# Patient Record
Sex: Female | Born: 1950
Health system: Southern US, Community
[De-identification: ages and names within clinical notes are randomized; demographics above are authoritative.]

## PROBLEM LIST (undated history)

## (undated) DIAGNOSIS — R011 Cardiac murmur, unspecified: Secondary | ICD-10-CM

## (undated) DIAGNOSIS — M1990 Unspecified osteoarthritis, unspecified site: Secondary | ICD-10-CM

## (undated) DIAGNOSIS — F419 Anxiety disorder, unspecified: Secondary | ICD-10-CM

## (undated) DIAGNOSIS — G43909 Migraine, unspecified, not intractable, without status migrainosus: Secondary | ICD-10-CM

## (undated) DIAGNOSIS — T7840XA Allergy, unspecified, initial encounter: Secondary | ICD-10-CM

## (undated) DIAGNOSIS — G43109 Migraine with aura, not intractable, without status migrainosus: Principal | ICD-10-CM

## (undated) HISTORY — DX: Migraine, unspecified, not intractable, without status migrainosus: G43.909

## (undated) HISTORY — DX: Cardiac murmur, unspecified: R01.1

## (undated) HISTORY — DX: Migraine with aura, not intractable, without status migrainosus: G43.109

## (undated) HISTORY — DX: Anxiety disorder, unspecified: F41.9

## (undated) HISTORY — DX: Unspecified osteoarthritis, unspecified site: M19.90

## (undated) HISTORY — DX: Allergy, unspecified, initial encounter: T78.40XA

## (undated) HISTORY — PX: LAPAROSCOPIC ABDOMINAL EXPLORATION: SHX6249

## (undated) HISTORY — PX: OTHER SURGICAL HISTORY: SHX169

---

## 1999-05-06 ENCOUNTER — Other Ambulatory Visit: Admission: RE | Admit: 1999-05-06 | Discharge: 1999-05-06 | Payer: Self-pay | Admitting: Obstetrics and Gynecology

## 2000-06-22 ENCOUNTER — Other Ambulatory Visit: Admission: RE | Admit: 2000-06-22 | Discharge: 2000-06-22 | Payer: Self-pay | Admitting: Obstetrics and Gynecology

## 2000-08-03 ENCOUNTER — Encounter: Payer: Self-pay | Admitting: Obstetrics and Gynecology

## 2000-08-03 ENCOUNTER — Encounter: Admission: RE | Admit: 2000-08-03 | Discharge: 2000-08-03 | Payer: Self-pay | Admitting: Obstetrics and Gynecology

## 2001-06-21 ENCOUNTER — Encounter: Admission: RE | Admit: 2001-06-21 | Discharge: 2001-06-21 | Payer: Self-pay | Admitting: Internal Medicine

## 2001-06-21 ENCOUNTER — Encounter: Payer: Self-pay | Admitting: Internal Medicine

## 2001-08-02 ENCOUNTER — Other Ambulatory Visit: Admission: RE | Admit: 2001-08-02 | Discharge: 2001-08-02 | Payer: Self-pay | Admitting: Obstetrics and Gynecology

## 2002-07-23 ENCOUNTER — Encounter: Admission: RE | Admit: 2002-07-23 | Discharge: 2002-07-23 | Payer: Self-pay | Admitting: Internal Medicine

## 2002-07-23 ENCOUNTER — Encounter: Payer: Self-pay | Admitting: Internal Medicine

## 2002-09-11 ENCOUNTER — Other Ambulatory Visit: Admission: RE | Admit: 2002-09-11 | Discharge: 2002-09-11 | Payer: Self-pay | Admitting: Obstetrics and Gynecology

## 2003-07-04 ENCOUNTER — Encounter: Payer: Self-pay | Admitting: Internal Medicine

## 2003-11-19 ENCOUNTER — Other Ambulatory Visit: Admission: RE | Admit: 2003-11-19 | Discharge: 2003-11-19 | Payer: Self-pay | Admitting: Obstetrics and Gynecology

## 2004-12-06 ENCOUNTER — Other Ambulatory Visit: Admission: RE | Admit: 2004-12-06 | Discharge: 2004-12-06 | Payer: Self-pay | Admitting: Obstetrics and Gynecology

## 2004-12-27 ENCOUNTER — Ambulatory Visit: Payer: Self-pay | Admitting: Internal Medicine

## 2004-12-30 ENCOUNTER — Ambulatory Visit: Payer: Self-pay | Admitting: Internal Medicine

## 2004-12-30 DIAGNOSIS — K3189 Other diseases of stomach and duodenum: Secondary | ICD-10-CM | POA: Insufficient documentation

## 2004-12-30 DIAGNOSIS — R1013 Epigastric pain: Secondary | ICD-10-CM

## 2004-12-31 ENCOUNTER — Ambulatory Visit: Payer: Self-pay | Admitting: Internal Medicine

## 2005-02-03 ENCOUNTER — Ambulatory Visit: Payer: Self-pay | Admitting: Internal Medicine

## 2007-06-04 ENCOUNTER — Ambulatory Visit: Payer: Self-pay | Admitting: Internal Medicine

## 2007-11-02 DIAGNOSIS — R109 Unspecified abdominal pain: Secondary | ICD-10-CM | POA: Insufficient documentation

## 2007-11-02 DIAGNOSIS — M5030 Other cervical disc degeneration, unspecified cervical region: Secondary | ICD-10-CM | POA: Insufficient documentation

## 2007-11-02 DIAGNOSIS — F411 Generalized anxiety disorder: Secondary | ICD-10-CM | POA: Insufficient documentation

## 2009-10-19 ENCOUNTER — Encounter (INDEPENDENT_AMBULATORY_CARE_PROVIDER_SITE_OTHER): Payer: Self-pay | Admitting: *Deleted

## 2009-12-03 ENCOUNTER — Encounter (INDEPENDENT_AMBULATORY_CARE_PROVIDER_SITE_OTHER): Payer: Self-pay | Admitting: *Deleted

## 2009-12-04 ENCOUNTER — Ambulatory Visit: Payer: Self-pay | Admitting: Internal Medicine

## 2009-12-18 ENCOUNTER — Ambulatory Visit: Payer: Self-pay | Admitting: Internal Medicine

## 2010-09-23 NOTE — Procedures (Signed)
Summary: Colonoscopy  Patient: Beth Chapman Note: All result statuses are Final unless otherwise noted.  Tests: (1) Colonoscopy (COL)   COL Colonoscopy           DONE     [facility]     [address]     [place], [place]  [address]           COLONOSCOPY PROCEDURE REPORT           PATIENT:  [name], [name]  MR#:  [id number]     BIRTHDATE:  [birth year], 58 yrs. old  GENDER:  female     ENDOSCOPIST:  Wilhemina Bonito. Eda Keys, MD     REF. BY:  Screening Recall     PROCEDURE DATE:  12/18/2009     PROCEDURE:  Average-risk screening colonoscopy     G0121     ASA CLASS:  Class II     INDICATIONS:  Routine Risk Screening ; NEGATIVE INDEX EXAM     06-2003; BROTHER AND SISTER WITH COLON POLYPS     MEDICATIONS:   Fentanyl 100 mcg IV, Versed 10 mg IV           DESCRIPTION OF PROCEDURE:   After the risks benefits and     alternatives of the procedure were thoroughly explained, informed     consent was obtained.  Digital rectal exam was performed and     revealed no abnormalities.   The LB CF-[id number] endoscope     was introduced through the anus and advanced to the cecum, which     was identified by both the appendix and ileocecal valve, without     limitations. TIME TO CECUM = 5:13 MIN. The quality of the prep was     excellent, using MoviPrep.  The instrument was then slowly     withdrawn (TIME = 11:29 MIN) as the colon was fully examined.     <<PROCEDUREIMAGES>>           FINDINGS:  A normal appearing cecum, ileocecal valve, and     appendiceal orifice were identified. The ascending, hepatic     flexure, transverse, splenic flexure, descending, sigmoid colon,     and rectum appeared unremarkable.  No polyps or cancers were seen.     Retroflexed views in the rectum revealed internal hemorrhoids.     The scope was then withdrawn from the patient and the procedure     completed.           COMPLICATIONS:  None     ENDOSCOPIC IMPRESSION:     1) Normal colon     2) No  polyps or cancers     3) Internal hemorrhoids           RECOMMENDATIONS:     1) Continue current colorectal screening recommendations for     "routine risk" patients with a repeat colonoscopy in 10 years.           ______________________________     Wilhemina Bonito. Eda Keys, MD           CC:  Nila Nephew, MD; The Patient           n.     eSIGNED:   Wilhemina Bonito. Eda Keys at 12/18/2009 02:33 PM           Gearldine Bienenstock, [id number]  Note: An exclamation mark (!) indicates a result that was not dispersed into the flowsheet. Document Creation Date: 12/18/2009 2:34 PM _______________________________________________________________________  (1)  Order result status: Final Collection or observation date-time: 12/18/2009 14:21 Requested date-time:  Receipt date-time:  Reported date-time:  Referring Physician:   Ordering Physician: Fransico Setters [phone]) Specimen Source:  Source: Launa Grill Order Number: [id number] Lab site:   Appended Document: Colonoscopy    Clinical Lists Changes  Observations: Added new observation of COLONNXTDUE: 11/2019 (12/18/2009 15:27)

## 2010-09-23 NOTE — Procedures (Signed)
Summary: Gastroenterology endo  Gastroenterology endo   Imported By: Donneta Romberg 11/02/2007 08:40:05  _____________________________________________________________________  External Attachment:    Type:   Image     Comment:   External Document

## 2010-09-23 NOTE — Letter (Signed)
Summary: Previsit letter  [facility]  [address] [place], [place] [address]   Phone: [phone]  Fax: [phone]       10/19/2009 MRN: [id number]  [facility] Beth Chapman [address] [place], [place]  [address]  Dear [name],  Welcome to the Gastroenterology Division at [facility].    You are scheduled to see a nurse for your pre-procedure visit on 12-04-09 at 4:30p.m. on the 3rd floor at [facility], [address]  We ask that you try to arrive at our office 15 minutes prior to your appointment time to allow for check-in.  Your nurse visit will consist of discussing your medical and surgical history, your immediate family medical history, and your medications.    Please bring a complete list of all your medications or, if you prefer, bring the medication bottles and we will list them.  We will need to be aware of both prescribed and over the counter drugs.  We will need to know exact dosage information as well.  If you are on blood thinners (Coumadin, Plavix, Aggrenox, Ticlid, etc.) please call our office today/prior to your appointment, as we need to consult with your physician about holding your medication.   Please be prepared to read and sign documents such as consent forms, a financial agreement, and acknowledgement forms.  If necessary, and with your consent, a friend or relative is welcome to sit-in on the nurse visit with you.  Please bring your insurance card so that we may make a copy of it.  If your insurance requires a referral to see a specialist, please bring your referral form from your primary care physician.  No co-pay is required for this nurse visit.     If you cannot keep your appointment, please call [phone] to cancel or reschedule prior to your appointment date.  This allows [place] the opportunity to schedule an appointment for another patient in need of care.    Thank you for choosing [facility] for your medical  needs.  We appreciate the opportunity to care for you.  Please visit [place] at our website  to learn more about our practice.                     Sincerely.                                                                                                                   The Gastroenterology Division

## 2010-09-23 NOTE — Miscellaneous (Signed)
Summary: [facility]  Clinical Lists Changes  Medications: Added new medication of MOVIPREP 100 GM  SOLR (PEG-KCL-NACL-NASULF-NA ASC-C) As per prep instructions. - Signed Rx of MOVIPREP 100 GM  SOLR (PEG-KCL-NACL-NASULF-NA ASC-C) As per prep instructions.;  #1 x 0;  Signed;  Entered by: Ezra Sites RN;  Authorized by: Hilarie Fredrickson MD;  Method used: Electronically to [facility] [place] [address], [place], [place]  [address], Ph: [phone], Fax: [phone] Observations: Added new observation of NKA: T (12/04/2009 16:31)    Prescriptions: MOVIPREP 100 GM  SOLR (PEG-KCL-NACL-NASULF-NA ASC-C) As per prep instructions.  #1 x 0   Entered by:   Ezra Sites RN   Authorized by:   Hilarie Fredrickson MD   Signed by:   Ezra Sites RN on 12/04/2009   Method used:   Electronically to        [facility] [place] [address] (retail)       [address] [place], [place]  [address]       Ph: [phone]       Fax: [phone]   RxID:   [id number]

## 2010-09-23 NOTE — Letter (Signed)
Summary: [facility] Instructions  [facility]  [address] [place], [place] [address]   Phone: [phone]  Fax: [phone]       Beth Chapman    1951-05-09    MRN: [id number]        Procedure Day /Date: Friday 12/18/2009     Arrival Time: 12:30 pm      Procedure Time: 1:30 pm_     Location of Procedure:                    _x _  [facility] (4th Floor)                        PREPARATION FOR COLONOSCOPY WITH MOVIPREP   Starting 5 days prior to your procedure Sunday 4/24 do not eat nuts, seeds, popcorn, corn, beans, peas,  salads, or any raw vegetables.  Do not take any fiber supplements (e.g. Metamucil, Citrucel, and Benefiber).  THE DAY BEFORE YOUR PROCEDURE         DATE: Thursday 4/28  1.  Drink clear liquids the entire day-NO SOLID FOOD  2.  Do not drink anything colored red or purple.  Avoid juices with pulp.  No orange juice.  3.  Drink at least 64 oz. (8 glasses) of fluid/clear liquids during the day to prevent dehydration and help the prep work efficiently.  CLEAR LIQUIDS INCLUDE: Water Jello Ice Popsicles Tea (sugar ok, no milk/cream) Powdered fruit flavored drinks Coffee (sugar ok, no milk/cream) Gatorade Juice: apple, white grape, white cranberry  Lemonade Clear bullion, consomm, broth Carbonated beverages (any kind) Strained chicken noodle soup Hard Candy                             4.  In the morning, mix first dose of MoviPrep solution:    Empty 1 Pouch A and 1 Pouch B into the disposable container    Add lukewarm drinking water to the top line of the container. Mix to dissolve    Refrigerate (mixed solution should be used within 24 hrs)  5.  Begin drinking the prep at 5:00 p.m. The MoviPrep container is divided by 4 marks.   Every 15 minutes drink the solution down to the next mark (approximately 8 oz) until the full liter is complete.   6.  Follow completed prep with 16 oz of clear liquid of your choice  (Nothing red or purple).  Continue to drink clear liquids until bedtime.  7.  Before going to bed, mix second dose of MoviPrep solution:    Empty 1 Pouch A and 1 Pouch B into the disposable container    Add lukewarm drinking water to the top line of the container. Mix to dissolve    Refrigerate  THE DAY OF YOUR PROCEDURE      DATE: Friday 4/29  Beginning at 8:30 a.m. (5 hours before procedure):         1. Every 15 minutes, drink the solution down to the next mark (approx 8 oz) until the full liter is complete.  2. Follow completed prep with 16 oz. of clear liquid of your choice.    3. You may drink clear liquids until 11:30 am (2 HOURS BEFORE PROCEDURE).   MEDICATION INSTRUCTIONS  Unless otherwise instructed, you should take regular prescription medications with a small sip of water   as early as possible the morning of your  procedure.           OTHER INSTRUCTIONS  You will need a responsible adult at least 58 years of age to accompany you and drive you home.   This person must remain in the waiting room during your procedure.  Wear loose fitting clothing that is easily removed.  Leave jewelry and other valuables at home.  However, you may wish to bring a book to read or  an iPod/MP3 player to listen to music as you wait for your procedure to start.  Remove all body piercing jewelry and leave at home.  Total time from sign-in until discharge is approximately 2-3 hours.  You should go home directly after your procedure and rest.  You can resume normal activities the  day after your procedure.  The day of your procedure you should not:   Drive   Make legal decisions   Operate machinery   Drink alcohol   Return to work  You will receive specific instructions about eating, activities and medications before you leave.    The above instructions have been reviewed and explained to me by   Ezra Sites RN  December 04, 2009 4:52 PM    I fully understand and  can verbalize these instructions _____________________________ Date _________

## 2010-09-24 NOTE — Procedures (Signed)
Summary: Gastroenterology colon  Gastroenterology colon   Imported By: Donneta Romberg 11/02/2007 08:42:37  _____________________________________________________________________  External Attachment:    Type:   Image     Comment:   External Document

## 2011-01-04 NOTE — Assessment & Plan Note (Signed)
[facility]                         GASTROENTEROLOGY OFFICE NOTE   [name], Chapman                   MRN:          [id number]  DATE:06/04/2007                            DOB:          01-17-1951    REFERRING PHYSICIAN:  Dr. Nila Nephew.   REASON FOR EVALUATION:  Abdominal discomfort.   HISTORY:  This is a 60 year old white female who is referred through the  courtesy of Dr. Chilton Si regarding persistent left upper quadrant burning  discomfort.  The patient has a history of nonulcerative dyspepsia for  which she underwent an evaluation in 2006.  Her complaints at that time  were epigastric discomfort and cramping.  Multiple laboratories,  including CBC, comprehensive metabolic panel, and lipase were normal.  Abdominal ultrasound was normal.  Upper endoscopy was normal.  In 2004,  complete colonoscopy was normal.  She equated her problems with pain  distress.  Nexium did not help.  She now reports developing problems  with pain in late April and early May.  She describes focal burning  sensation in the left upper quadrant.  She describes it as if somebody  were rubbing sandpaper in that area.  The discomfort is anterior.  There  is no radiation.  It is not made better or worse with activity, meals,  defecation, or body position.  She does mention that it is worse with  stress.  There is no pain at night after falling asleep.  She denies  nausea, vomiting, heartburn, change in bowel habits, melena, or  hematochezia.  She has had no weight loss.  She started back on Nexium  today.  She has been on it twice daily with no improvement obtained.  She mentioned this problem to Dr. Chilton Si, who recommended GI evaluation.  The patient reports her current source of stress relates to her job.  She works for [employer], who is under a great deal of financial strain.  There  have been multiple layoffs and increasing work loads for those left  behind.   PAST MEDICAL  HISTORY:  Cervical disk disease, wisdom tooth extraction,  and anxiety.   PAST SURGICAL HISTORY:  Negative.   ALLERGIES:  No known drug allergies.   CURRENT MEDICATIONS:  1. Calcium.  2. Magnesium.  3. Nexium 40 mg b.i.d.  4. Zyflamend 1 b.i.d.   FAMILY HISTORY:  Negative for gastrointestinal malignancy.   SOCIAL HISTORY:  The patient is married and works as an Recruitment consultant at [employer].  Occasionally drinks wine.  Does not  smoke.   PHYSICAL EXAM:  Well-appearing female in no acute distress.  Blood pressure 120/80, heart rate 68 and regular, weight 133.8 pounds.  HEENT:  Sclerae anicteric, conjunctivae pink, oral mucosa is intact.  There is no adenopathy.  LUNGS:  Clear.  HEART:  Regular.  ABDOMEN:  Soft without tenderness, mass, or hernia.  Good bowel sounds  heard.  EXTREMITIES:  Without edema.   IMPRESSION:  This is a 60 year old female with a history of  nonulcerative dyspepsia who presents with a 10-month history of  intermittent focal burning discomfort in the left upper quadrant,  affected  principally by stress. This on a background of nonulcerative  dyspepsia.  Given the patient's prior history, lack of response to  empiric therapy, lack of worsening signs or symptoms, and in particular  the present history, I feel that she most certainly has functional  dyspepsia related to stress.  As such, I do not feel additional GI  workup or empiric GI therapies are warranted.  I do, however, recommend  that she return to Dr. Chilton Si to consider medical therapy for her  anxiety, which may ultimately result in improvement in her abdominal  complaint.     Wilhemina Bonito. Marina Goodell, MD  Electronically Signed    JNP/MedQ  DD: 06/04/2007  DT: 06/05/2007  Job #: [id number]   cc:   Erskine Speed, M.D.

## 2013-06-04 ENCOUNTER — Other Ambulatory Visit (HOSPITAL_COMMUNITY): Payer: Self-pay | Admitting: Obstetrics and Gynecology

## 2013-06-04 DIAGNOSIS — R011 Cardiac murmur, unspecified: Secondary | ICD-10-CM

## 2013-06-14 ENCOUNTER — Other Ambulatory Visit (HOSPITAL_COMMUNITY): Payer: 59

## 2013-06-17 ENCOUNTER — Ambulatory Visit (HOSPITAL_COMMUNITY)
Admission: RE | Admit: 2013-06-17 | Discharge: 2013-06-17 | Disposition: A | Discharge status: Home / Self Care | Payer: 59 | Source: Ambulatory Visit | Attending: Obstetrics and Gynecology | Admitting: Obstetrics and Gynecology

## 2013-06-17 DIAGNOSIS — R011 Cardiac murmur, unspecified: Secondary | ICD-10-CM

## 2013-06-17 DIAGNOSIS — I059 Rheumatic mitral valve disease, unspecified: Secondary | ICD-10-CM | POA: Insufficient documentation

## 2013-06-17 DIAGNOSIS — I079 Rheumatic tricuspid valve disease, unspecified: Secondary | ICD-10-CM | POA: Insufficient documentation

## 2013-06-17 NOTE — Progress Notes (Signed)
  Echocardiogram 2D Echocardiogram has been performed.  Jorje Guild 06/17/2013, 2:46 PM

## 2013-06-27 ENCOUNTER — Other Ambulatory Visit: Payer: Self-pay

## 2013-11-12 ENCOUNTER — Ambulatory Visit (INDEPENDENT_AMBULATORY_CARE_PROVIDER_SITE_OTHER): Payer: 59 | Admitting: Neurology

## 2013-11-12 ENCOUNTER — Encounter: Payer: Self-pay | Admitting: Neurology

## 2013-11-12 VITALS — BP 117/78 | HR 72 | Ht 62.0 in | Wt 134.0 lb

## 2013-11-12 DIAGNOSIS — G43109 Migraine with aura, not intractable, without status migrainosus: Secondary | ICD-10-CM | POA: Insufficient documentation

## 2013-11-12 HISTORY — DX: Migraine with aura, not intractable, without status migrainosus: G43.109

## 2013-11-12 NOTE — Progress Notes (Signed)
Reason for visit: Visual disturbance   Beth Chapman is a 63 y.o. female  History of present illness:  Beth Chapman is a 63 year old right-handed white female with a history of migraine headache in the past. The patient has classic migraine associated with visual phenomenon sometimes followed by headache. Within the last 6 weeks, the patient has had 3 or 4 episodes of visual phenomenon that will last about 30 minutes. The patient will note a "bar code" shaped disturbance that associated with bright colors. This will begin in the right inferior homonymous field, and may migrate superiorly. The patient is not sure whether the visual phenomena crosses midline or not. The visual phenomenon will resolve within 30 minutes, and the patient will not get a headache afterwards. The patient reports no other symptoms such as clouded consciousness, or numbness or weakness on the face, arms, or legs. The patient has no nausea. The patient was seen by her optometrist, and she is referred to this office for an evaluation. The patient does report increased problems with menopause symptoms over the last 3 months.  Past Medical History  Diagnosis Date  . Migraines   . Classic migraine 11/12/2013    Past Surgical History  Procedure Laterality Date  . None      Family History  Problem Relation Age of Onset  . Dementia Father   . Kidney failure Brother   . Heart attack Brother   . Diabetes Brother   . Migraines Neg Hx     Social history:  reports that she has never smoked. She does not have any smokeless tobacco history on file. She reports that she drinks alcohol. She reports that she does not use illicit drugs.  Medications:  No current outpatient prescriptions on file prior to visit.   No current facility-administered medications on file prior to visit.     No Known Allergies  ROS:  Out of a complete 14 system review of symptoms, the patient complains only of the following symptoms, and  all other reviewed systems are negative.  Blurred vision, loss of vision  Blood pressure 117/78, pulse 72, height 5\' 2"  (1.575 m), weight 134 lb (60.782 kg).  Physical Exam  General: The patient is alert and cooperative at the time of the examination.  Eyes: Pupils are equal, round, and reactive to light. Discs are flat bilaterally.  Neck: The neck is supple, no carotid bruits are noted.  Respiratory: The respiratory examination is clear.  Cardiovascular: The cardiovascular examination reveals a regular rate and rhythm, no obvious murmurs or rubs are noted.  Neuromuscular: The patient has good range of movement of the cervical spine. No crepitus is noted in the temporomandibular joints.  Skin: Extremities are without significant edema.  Neurologic Exam  Mental status: The patient is alert and oriented x 3 at the time of the examination. The patient has apparent normal recent and remote memory, with an apparently normal attention span and concentration ability.  Cranial nerves: Facial symmetry is present. There is good sensation of the face to pinprick and soft touch bilaterally. The strength of the facial muscles and the muscles to head turning and shoulder shrug are normal bilaterally. Speech is well enunciated, no aphasia or dysarthria is noted. Extraocular movements are full. Visual fields are full. The tongue is midline, and the patient has symmetric elevation of the soft palate. No obvious hearing deficits are noted.  Motor: The motor testing reveals 5 over 5 strength of all 4 extremities. Good symmetric  motor tone is noted throughout.  Sensory: Sensory testing is intact to pinprick, soft touch, vibration sensation, and position sense on all 4 extremities. No evidence of extinction is noted.  Coordination: Cerebellar testing reveals good finger-nose-finger and heel-to-shin bilaterally.  Gait and station: Gait is normal. Tandem gait is normal. Romberg is negative. No drift is  seen.  Reflexes: Deep tendon reflexes are symmetric and normal bilaterally. Toes are downgoing bilaterally.   Assessment/Plan:  1. Classic migraine, migraine equivalent  The patient is having episodes of homonymous visual field disturbances associated with a classic migraine phenomenon, but the patient is not having migraine headache. The patient is having an event every 2 weeks or so. At this point, there is no indication for treatment. The patient will contact our office if the episodes become much more frequent, or headache ensues. The patient will followup when necessary.  Marlan Palau. Keith Willis MD 11/12/2013 8:00 PM  [facility] [address] [place], [place] [id number]  Phone [phone] Fax [phone]

## 2013-11-12 NOTE — Patient Instructions (Signed)
Migraine Headache A migraine headache is an intense, throbbing pain on one or both sides of your head. A migraine can last for 30 minutes to several hours. CAUSES  The exact cause of a migraine headache is not always known. However, a migraine may be caused when nerves in the brain become irritated and release chemicals that cause inflammation. This causes pain. Certain things may also trigger migraines, such as:  Alcohol.  Smoking.  Stress.  Menstruation.  Aged cheeses.  Foods or drinks that contain nitrates, glutamate, aspartame, or tyramine.  Lack of sleep.  Chocolate.  Caffeine.  Hunger.  Physical exertion.  Fatigue.  Medicines used to treat chest pain (nitroglycerine), birth control pills, estrogen, and some blood pressure medicines. SIGNS AND SYMPTOMS  Pain on one or both sides of your head.  Pulsating or throbbing pain.  Severe pain that prevents daily activities.  Pain that is aggravated by any physical activity.  Nausea, vomiting, or both.  Dizziness.  Pain with exposure to bright lights, loud noises, or activity.  General sensitivity to bright lights, loud noises, or smells. Before you get a migraine, you may get warning signs that a migraine is coming (aura). An aura may include:  Seeing flashing lights.  Seeing bright spots, halos, or zig-zag lines.  Having tunnel vision or blurred vision.  Having feelings of numbness or tingling.  Having trouble talking.  Having muscle weakness. DIAGNOSIS  A migraine headache is often diagnosed based on:  Symptoms.  Physical exam.  A CT scan or MRI of your head. These imaging tests cannot diagnose migraines, but they can help rule out other causes of headaches. TREATMENT Medicines may be given for pain and nausea. Medicines can also be given to help prevent recurrent migraines.  HOME CARE INSTRUCTIONS  Only take over-the-counter or prescription medicines for pain or discomfort as directed by your  health care provider. The use of long-term narcotics is not recommended.  Lie down in a dark, quiet room when you have a migraine.  Keep a journal to find out what may trigger your migraine headaches. For example, write down:  What you eat and drink.  How much sleep you get.  Any change to your diet or medicines.  Limit alcohol consumption.  Quit smoking if you smoke.  Get 7 9 hours of sleep, or as recommended by your health care provider.  Limit stress.  Keep lights dim if bright lights bother you and make your migraines worse. SEEK IMMEDIATE MEDICAL CARE IF:   Your migraine becomes severe.  You have a fever.  You have a stiff neck.  You have vision loss.  You have muscular weakness or loss of muscle control.  You start losing your balance or have trouble walking.  You feel faint or pass out.  You have severe symptoms that are different from your first symptoms. MAKE SURE YOU:   Understand these instructions.  Will watch your condition.  Will get help right away if you are not doing well or get worse. Document Released: 08/08/2005 Document Revised: 05/29/2013 Document Reviewed: 04/15/2013 Exit[employer] Patient Information 2014 [employer].  

## 2014-07-02 ENCOUNTER — Telehealth: Payer: Self-pay | Admitting: Cardiovascular Disease

## 2014-07-02 NOTE — Telephone Encounter (Signed)
Received records from [facility] ( Dr Richardean ChimeraJohn Mccomb) for appointment with Dr Royann Shiversroitoru on 08/25/14.  [facility] Dr Croitoru's schedule on 08/25/14. lp

## 2014-08-25 ENCOUNTER — Encounter: Payer: Self-pay | Admitting: Cardiovascular Disease

## 2014-08-25 ENCOUNTER — Ambulatory Visit (INDEPENDENT_AMBULATORY_CARE_PROVIDER_SITE_OTHER): Payer: 59 | Admitting: Cardiovascular Disease

## 2014-08-25 VITALS — BP 130/70 | HR 67 | Resp 20 | Ht 62.0 in | Wt 132.8 lb

## 2014-08-25 DIAGNOSIS — R931 Abnormal findings on diagnostic imaging of heart and coronary circulation: Secondary | ICD-10-CM

## 2014-08-25 NOTE — Patient Instructions (Signed)
Follow up as needed

## 2014-08-26 ENCOUNTER — Encounter: Payer: Self-pay | Admitting: Cardiovascular Disease

## 2014-08-26 NOTE — Progress Notes (Signed)
Patient ID: Beth MorganMarianna L Chapman, female   DOB: 10/09/1950, 64 y.o.   MRN: [id number]      Reason for office visit Mitral valve prolapse and mild-moderate insufficiency  This is Beth Chapman's first evaluation by a Cardiologist, but a diagnosis of MVP was established by physical exam 30 years ago. It was confirmed by echo for the first time last year and that study triggered the referral. She has never had cardiac symptoms and does not have other serious medical illnesses. She was told in the past to take endocarditis prophylaxis, but this is no longer recommended per more recent guidelines.  She is physically fit and has no exercise related complaints. Her husband is my patient.   No Known Allergies  Current Outpatient Prescriptions  Medication Sig Dispense Refill  . ALPRAZolam (XANAX) 0.25 MG tablet Take 0.25 mg by mouth as needed.    Marland Kitchen. ibuprofen (ADVIL,MOTRIN) 200 MG tablet Take 200 mg by mouth as needed.     No current facility-administered medications for this visit.    Past Medical History  Diagnosis Date  . Migraines   . Classic migraine 11/12/2013    Past Surgical History  Procedure Laterality Date  . None      Family History  Problem Relation Age of Onset  . Dementia Father   . Kidney failure Brother   . Heart attack Brother   . Diabetes Brother   . Migraines Neg Hx     History   Social History  . Marital Status: Married    Spouse Name: leonard    Number of Children: 0  . Years of Education: college   Occupational History  . [employer]    Social History Main Topics  . Smoking status: Never Smoker   . Smokeless tobacco: Not on file  . Alcohol Use: Yes     Comment: occasionally  . Drug Use: No  . Sexual Activity: Not on file   Other Topics Concern  . Not on file   Social History Narrative    Review of systems: The patient specifically denies any chest pain at rest or with exertion, dyspnea at rest or with exertion, orthopnea,  paroxysmal nocturnal dyspnea, syncope, palpitations, focal neurological deficits, intermittent claudication, lower extremity edema, unexplained weight gain, cough, hemoptysis or wheezing.  The patient also denies abdominal pain, nausea, vomiting, dysphagia, diarrhea, constipation, polyuria, polydipsia, dysuria, hematuria, frequency, urgency, abnormal bleeding or bruising, fever, chills, unexpected weight changes, mood swings, change in skin or hair texture, change in voice quality, auditory or visual problems, allergic reactions or rashes, new musculoskeletal complaints other than usual "aches and pains".   PHYSICAL EXAM BP 130/70 mmHg  Pulse 67  Ht 5\' 2"  (1.575 m)  Wt 132 lb 12.8 oz (60.238 kg)  BMI 24.28 kg/m2  General: Alert, oriented x3, no distress Head: no evidence of trauma, PERRL, EOMI, no exophtalmos or lid lag, no myxedema, no xanthelasma; normal ears, nose and oropharynx Neck: normal jugular venous pulsations and no hepatojugular reflux; brisk carotid pulses without delay and no carotid bruits Chest: clear to auscultation, no signs of consolidation by percussion or palpation, normal fremitus, symmetrical and full respiratory excursions Cardiovascular: normal position and quality of the apical impulse, regular rhythm, normal first and second heart sounds, very distinct late systolic click and apical late systolic murmur radiating to axilla, intensified after the Valsalva maneuver and attenuated by handgrip. Abdomen: no tenderness or distention, no masses by palpation, no abnormal pulsatility or arterial bruits, normal bowel sounds,  no hepatosplenomegaly Extremities: no clubbing, cyanosis or edema; 2+ radial, ulnar and brachial pulses bilaterally; 2+ right femoral, posterior tibial and dorsalis pedis pulses; 2+ left femoral, posterior tibial and dorsalis pedis pulses; no subclavian or femoral bruits Neurological: grossly nonfocal   EKG: Low atrial ectopic rhythm (negative P waves in  the inferior leads, short PR), incomplete RBBB, otherwise normal  ECHO images reviewed: myxomatous valve with bileaflet prolapse and 2+ MR, normal LV and LA size, normal LVEF and other valves, no dilation of the aortic root  ASSESSMENT AND PLAN Asymptomatic classic mitral valve prolapse syndrome without features of more complex connective tissue disorder and with MR that is not hemodynamically important. We discussed the benign nature of MVP in most patients as well as the warning signs that would trigger further evaluation for worsening MR or chordal rupture or endocarditis. Follow up as needed.  Orders Placed This Encounter  Procedures  . EKG 12-Lead   No orders of the defined types were placed in this encounter.    Junious Silk, MD, [facility] [phone] office [phone] pager

## 2015-12-31 ENCOUNTER — Encounter: Payer: Self-pay | Admitting: Internal Medicine

## 2016-01-07 DIAGNOSIS — R52 Pain, unspecified: Secondary | ICD-10-CM | POA: Diagnosis not present

## 2016-01-15 DIAGNOSIS — J069 Acute upper respiratory infection, unspecified: Secondary | ICD-10-CM | POA: Diagnosis not present

## 2016-01-28 DIAGNOSIS — R52 Pain, unspecified: Secondary | ICD-10-CM | POA: Diagnosis not present

## 2016-01-28 DIAGNOSIS — G5620 Lesion of ulnar nerve, unspecified upper limb: Secondary | ICD-10-CM | POA: Diagnosis not present

## 2016-04-08 DIAGNOSIS — Z1231 Encounter for screening mammogram for malignant neoplasm of breast: Secondary | ICD-10-CM | POA: Diagnosis not present

## 2016-06-15 DIAGNOSIS — Z23 Encounter for immunization: Secondary | ICD-10-CM | POA: Diagnosis not present

## 2016-06-17 DIAGNOSIS — Z0000 Encounter for general adult medical examination without abnormal findings: Secondary | ICD-10-CM | POA: Diagnosis not present

## 2016-07-11 DIAGNOSIS — I341 Nonrheumatic mitral (valve) prolapse: Secondary | ICD-10-CM | POA: Diagnosis not present

## 2016-07-11 DIAGNOSIS — K219 Gastro-esophageal reflux disease without esophagitis: Secondary | ICD-10-CM | POA: Diagnosis not present

## 2016-07-11 DIAGNOSIS — Z0000 Encounter for general adult medical examination without abnormal findings: Secondary | ICD-10-CM | POA: Diagnosis not present

## 2016-07-11 DIAGNOSIS — D559 Anemia due to enzyme disorder, unspecified: Secondary | ICD-10-CM | POA: Diagnosis not present

## 2016-07-11 DIAGNOSIS — G5600 Carpal tunnel syndrome, unspecified upper limb: Secondary | ICD-10-CM | POA: Diagnosis not present

## 2016-07-11 DIAGNOSIS — M1990 Unspecified osteoarthritis, unspecified site: Secondary | ICD-10-CM | POA: Diagnosis not present

## 2016-07-11 DIAGNOSIS — E7800 Pure hypercholesterolemia, unspecified: Secondary | ICD-10-CM | POA: Diagnosis not present

## 2016-07-21 DIAGNOSIS — Z01419 Encounter for gynecological examination (general) (routine) without abnormal findings: Secondary | ICD-10-CM | POA: Diagnosis not present

## 2016-07-21 DIAGNOSIS — Z6825 Body mass index (BMI) 25.0-25.9, adult: Secondary | ICD-10-CM | POA: Diagnosis not present

## 2016-10-12 DIAGNOSIS — H25813 Combined forms of age-related cataract, bilateral: Secondary | ICD-10-CM | POA: Diagnosis not present

## 2017-04-21 DIAGNOSIS — Z1231 Encounter for screening mammogram for malignant neoplasm of breast: Secondary | ICD-10-CM | POA: Diagnosis not present

## 2017-06-14 DIAGNOSIS — Z23 Encounter for immunization: Secondary | ICD-10-CM | POA: Diagnosis not present

## 2017-06-15 DIAGNOSIS — Z0000 Encounter for general adult medical examination without abnormal findings: Secondary | ICD-10-CM | POA: Diagnosis not present

## 2017-07-11 DIAGNOSIS — Z6841 Body Mass Index (BMI) 40.0 and over, adult: Secondary | ICD-10-CM | POA: Diagnosis not present

## 2017-07-11 DIAGNOSIS — I341 Nonrheumatic mitral (valve) prolapse: Secondary | ICD-10-CM | POA: Diagnosis not present

## 2017-07-11 DIAGNOSIS — G43109 Migraine with aura, not intractable, without status migrainosus: Secondary | ICD-10-CM | POA: Diagnosis not present

## 2017-07-24 DIAGNOSIS — Z6826 Body mass index (BMI) 26.0-26.9, adult: Secondary | ICD-10-CM | POA: Diagnosis not present

## 2017-07-24 DIAGNOSIS — Z01419 Encounter for gynecological examination (general) (routine) without abnormal findings: Secondary | ICD-10-CM | POA: Diagnosis not present

## 2017-10-26 DIAGNOSIS — H43393 Other vitreous opacities, bilateral: Secondary | ICD-10-CM | POA: Diagnosis not present

## 2018-01-26 DIAGNOSIS — G5601 Carpal tunnel syndrome, right upper limb: Secondary | ICD-10-CM | POA: Diagnosis not present

## 2018-01-26 DIAGNOSIS — M542 Cervicalgia: Secondary | ICD-10-CM | POA: Diagnosis not present

## 2018-03-16 DIAGNOSIS — G5620 Lesion of ulnar nerve, unspecified upper limb: Secondary | ICD-10-CM | POA: Diagnosis not present

## 2018-03-16 DIAGNOSIS — M5030 Other cervical disc degeneration, unspecified cervical region: Secondary | ICD-10-CM | POA: Diagnosis not present

## 2018-04-06 ENCOUNTER — Ambulatory Visit
Admission: RE | Admit: 2018-04-06 | Discharge: 2018-04-06 | Disposition: A | Discharge status: Home / Self Care | Payer: Self-pay | Source: Ambulatory Visit | Attending: Internal Medicine | Admitting: Internal Medicine

## 2018-04-06 ENCOUNTER — Other Ambulatory Visit: Payer: Self-pay | Admitting: Internal Medicine

## 2018-04-06 DIAGNOSIS — M47814 Spondylosis without myelopathy or radiculopathy, thoracic region: Secondary | ICD-10-CM | POA: Diagnosis not present

## 2018-04-06 DIAGNOSIS — R52 Pain, unspecified: Secondary | ICD-10-CM

## 2018-04-06 DIAGNOSIS — G5601 Carpal tunnel syndrome, right upper limb: Secondary | ICD-10-CM | POA: Diagnosis not present

## 2018-04-06 DIAGNOSIS — M5030 Other cervical disc degeneration, unspecified cervical region: Secondary | ICD-10-CM | POA: Diagnosis not present

## 2018-04-06 DIAGNOSIS — M47812 Spondylosis without myelopathy or radiculopathy, cervical region: Secondary | ICD-10-CM | POA: Diagnosis not present

## 2018-04-10 ENCOUNTER — Ambulatory Visit
Admission: RE | Admit: 2018-04-10 | Discharge: 2018-04-10 | Disposition: A | Discharge status: Home / Self Care | Payer: BLUE CROSS/BLUE SHIELD | Source: Ambulatory Visit | Attending: Internal Medicine | Admitting: Internal Medicine

## 2018-04-10 ENCOUNTER — Other Ambulatory Visit: Payer: Self-pay | Admitting: Internal Medicine

## 2018-04-10 DIAGNOSIS — R079 Chest pain, unspecified: Secondary | ICD-10-CM | POA: Diagnosis not present

## 2018-04-12 DIAGNOSIS — M62838 Other muscle spasm: Secondary | ICD-10-CM | POA: Diagnosis not present

## 2018-04-27 DIAGNOSIS — Z1231 Encounter for screening mammogram for malignant neoplasm of breast: Secondary | ICD-10-CM | POA: Diagnosis not present

## 2018-05-17 DIAGNOSIS — M542 Cervicalgia: Secondary | ICD-10-CM | POA: Diagnosis not present

## 2018-05-17 DIAGNOSIS — M179 Osteoarthritis of knee, unspecified: Secondary | ICD-10-CM | POA: Diagnosis not present

## 2018-05-26 DIAGNOSIS — Z23 Encounter for immunization: Secondary | ICD-10-CM | POA: Diagnosis not present

## 2018-06-11 DIAGNOSIS — Z0000 Encounter for general adult medical examination without abnormal findings: Secondary | ICD-10-CM | POA: Diagnosis not present

## 2018-07-12 ENCOUNTER — Other Ambulatory Visit: Payer: Self-pay | Admitting: Internal Medicine

## 2018-07-12 DIAGNOSIS — M1990 Unspecified osteoarthritis, unspecified site: Secondary | ICD-10-CM | POA: Diagnosis not present

## 2018-07-12 DIAGNOSIS — M542 Cervicalgia: Secondary | ICD-10-CM | POA: Diagnosis not present

## 2018-07-12 DIAGNOSIS — K219 Gastro-esophageal reflux disease without esophagitis: Secondary | ICD-10-CM | POA: Diagnosis not present

## 2018-07-12 DIAGNOSIS — R945 Abnormal results of liver function studies: Principal | ICD-10-CM

## 2018-07-12 DIAGNOSIS — R7989 Other specified abnormal findings of blood chemistry: Secondary | ICD-10-CM

## 2018-07-12 DIAGNOSIS — Z1211 Encounter for screening for malignant neoplasm of colon: Secondary | ICD-10-CM | POA: Diagnosis not present

## 2018-07-12 DIAGNOSIS — Z6841 Body Mass Index (BMI) 40.0 and over, adult: Secondary | ICD-10-CM | POA: Diagnosis not present

## 2018-07-17 ENCOUNTER — Ambulatory Visit
Admission: RE | Admit: 2018-07-17 | Discharge: 2018-07-17 | Disposition: A | Discharge status: Home / Self Care | Payer: BLUE CROSS/BLUE SHIELD | Source: Ambulatory Visit | Attending: Internal Medicine | Admitting: Internal Medicine

## 2018-07-17 DIAGNOSIS — R7989 Other specified abnormal findings of blood chemistry: Secondary | ICD-10-CM

## 2018-07-17 DIAGNOSIS — R945 Abnormal results of liver function studies: Principal | ICD-10-CM

## 2018-07-26 ENCOUNTER — Other Ambulatory Visit: Payer: Self-pay | Admitting: Internal Medicine

## 2018-07-26 DIAGNOSIS — N281 Cyst of kidney, acquired: Secondary | ICD-10-CM

## 2018-07-31 DIAGNOSIS — Z01419 Encounter for gynecological examination (general) (routine) without abnormal findings: Secondary | ICD-10-CM | POA: Diagnosis not present

## 2018-07-31 DIAGNOSIS — Z6826 Body mass index (BMI) 26.0-26.9, adult: Secondary | ICD-10-CM | POA: Diagnosis not present

## 2018-08-08 ENCOUNTER — Ambulatory Visit
Admission: RE | Admit: 2018-08-08 | Discharge: 2018-08-08 | Disposition: A | Discharge status: Home / Self Care | Payer: BLUE CROSS/BLUE SHIELD | Source: Ambulatory Visit | Attending: Internal Medicine | Admitting: Internal Medicine

## 2018-08-08 DIAGNOSIS — N281 Cyst of kidney, acquired: Secondary | ICD-10-CM | POA: Diagnosis not present

## 2018-08-08 MED ORDER — GADOBENATE DIMEGLUMINE 529 MG/ML IV SOLN
12.0000 mL | Freq: Once | INTRAVENOUS | Status: AC | PRN
  Administered 2018-08-08: 12 mL via INTRAVENOUS

## 2018-08-20 ENCOUNTER — Encounter: Payer: Self-pay | Admitting: Internal Medicine

## 2018-08-20 ENCOUNTER — Telehealth: Payer: Self-pay | Admitting: Internal Medicine

## 2018-08-20 NOTE — Telephone Encounter (Signed)
Pt sched 09/12/2018 @10am  with Dr.Perry

## 2018-08-20 NOTE — Telephone Encounter (Signed)
I have not seen this patient in years.  This is a new problem.  She should schedule a routine office appointment for assessment and further discussion.  Thanks

## 2018-08-20 NOTE — Telephone Encounter (Signed)
Pt called in and stated she had a ct done of the abdo by her pcp and it reveal cyst in the pancreas and she would like for dr.Perry to take a look at the results.

## 2018-08-20 NOTE — Telephone Encounter (Signed)
Please see note below from Dr. Marina GoodellPerry and schedule pt an appt to see him and discuss.

## 2018-08-20 NOTE — Telephone Encounter (Signed)
Pt had MRI done that was ordered by her PCP, there were some cysts on the pancreas found. Pt would like Dr. Marina GoodellPerry to look at MRI report and see if she needs to do anything different. Report in epic.

## 2018-09-12 ENCOUNTER — Encounter: Payer: Self-pay | Admitting: Internal Medicine

## 2018-09-12 ENCOUNTER — Ambulatory Visit: Payer: BLUE CROSS/BLUE SHIELD | Admitting: Internal Medicine

## 2018-09-12 VITALS — BP 140/80 | HR 60 | Ht 62.0 in | Wt 144.0 lb

## 2018-09-12 DIAGNOSIS — R935 Abnormal findings on diagnostic imaging of other abdominal regions, including retroperitoneum: Secondary | ICD-10-CM

## 2018-09-12 DIAGNOSIS — K862 Cyst of pancreas: Secondary | ICD-10-CM | POA: Diagnosis not present

## 2018-09-12 NOTE — Progress Notes (Signed)
HISTORY OF PRESENT ILLNESS:  Beth Chapman is a 68 y.o. female, [employer] employee, who is self-referred regarding abnormal pancreatic imaging on MRI.  I have seen the patient remotely for upper endoscopy in 2006 (normal) and screening colonoscopy in 2004 and 2011 (both normal).  Patient underwent an abdominal ultrasound July 17, 2018 to evaluate elevated liver test.  She was found to have an abnormality of the left kidney for which MRI was recommended and performed August 08, 2018.  The kidney lesions were said to be simple cysts.  She was incidentally noted to have tiny subcentimeter cystic lesions in the pancreas measuring up to 8 mm.  No other pancreatic abnormalities.  Repeat MRI/MRCP in 1 year recommended.  Patient has healthcare professionals in her family who recommended a subspecialty opinion.  Patient denies abdominal pain or weight loss.  No personal history of pancreatitis.  No family history of pancreatic cancer.  Her appetite and weight are good.  GI review of systems is otherwise negative  REVIEW OF SYSTEMS:  All non-GI ROS negative unless otherwise stated in the HPI except for anxiety, arthritis, night sweats, voice change  Past Medical History:  Diagnosis Date  . Anxiety   . Classic migraine 11/12/2013  . Migraines     Past Surgical History:  Procedure Laterality Date  . LAPAROSCOPIC ABDOMINAL EXPLORATION    . none      Social History Beth Chapman  reports that she has never smoked. She has never used smokeless tobacco. She reports current alcohol use. She reports that she does not use drugs.  family history includes Dementia in her father; Diabetes in her brother; Heart attack in her brother; Kidney failure in her brother.  No Known Allergies     PHYSICAL EXAMINATION: Vital signs: BP 140/80   Pulse 60   Ht 5\' 2"  (1.575 m)   Wt 144 lb (65.3 kg)   BMI 26.34 kg/m   Constitutional: generally well-appearing, no acute distress Psychiatric: alert and  oriented x3, cooperative Eyes: extraocular movements intact, anicteric, conjunctiva pink Mouth: oral pharynx moist, no lesions Neck: supple no lymphadenopathy Cardiovascular: heart regular rate and rhythm, no murmur Lungs: clear to auscultation bilaterally Abdomen: soft, nontender, nondistended, no obvious ascites, no peritoneal signs, normal bowel sounds, no organomegaly Rectal: Omitted Extremities: No clubbing, cyanosis, or lower extremity edema bilaterally Skin: no lesions on visible extremities Neuro: No focal deficits.  Cranial nerves intact.  ASSESSMENT:  1.  Diminutive incidental cystic lesions of the pancreas with benign radiographic features.  Clinical significance uncertain but certainly felt to be benign at this point. 2.  Negative screening colonoscopy 2004 and 2011   PLAN:  1.  Discussed the differential diagnosis of cystic lesions of the pancreas.  Discussed the current guidelines with regards to monitoring with imaging and when necessary advanced imaging and sampling with endoscopic ultrasound.  At this point she warrants repeat MRI and MRCP in 1 year.  I have asked my CMA to enter this into the recall system.  Patient aware 2.  Due for routine follow-up screening colonoscopy around 2014.  She is aware  A copy of this consultation note has been sent to her primary care provider Dr. Chilton Si

## 2018-09-12 NOTE — Patient Instructions (Signed)
We will call you around November 2020 to schedule an MRI/MRCP for December.

## 2019-02-01 DIAGNOSIS — Z780 Asymptomatic menopausal state: Secondary | ICD-10-CM | POA: Diagnosis not present

## 2019-02-01 DIAGNOSIS — M8589 Other specified disorders of bone density and structure, multiple sites: Secondary | ICD-10-CM | POA: Diagnosis not present

## 2019-05-03 DIAGNOSIS — Z803 Family history of malignant neoplasm of breast: Secondary | ICD-10-CM | POA: Diagnosis not present

## 2019-05-03 DIAGNOSIS — Z1231 Encounter for screening mammogram for malignant neoplasm of breast: Secondary | ICD-10-CM | POA: Diagnosis not present

## 2019-06-06 DIAGNOSIS — Z23 Encounter for immunization: Secondary | ICD-10-CM | POA: Diagnosis not present

## 2019-07-16 DIAGNOSIS — L814 Other melanin hyperpigmentation: Secondary | ICD-10-CM | POA: Diagnosis not present

## 2019-07-16 DIAGNOSIS — D225 Melanocytic nevi of trunk: Secondary | ICD-10-CM | POA: Diagnosis not present

## 2019-07-16 DIAGNOSIS — L821 Other seborrheic keratosis: Secondary | ICD-10-CM | POA: Diagnosis not present

## 2019-07-16 DIAGNOSIS — D1801 Hemangioma of skin and subcutaneous tissue: Secondary | ICD-10-CM | POA: Diagnosis not present

## 2019-07-25 DIAGNOSIS — M1990 Unspecified osteoarthritis, unspecified site: Secondary | ICD-10-CM | POA: Diagnosis not present

## 2019-07-25 DIAGNOSIS — I341 Nonrheumatic mitral (valve) prolapse: Secondary | ICD-10-CM | POA: Diagnosis not present

## 2019-07-25 DIAGNOSIS — K219 Gastro-esophageal reflux disease without esophagitis: Secondary | ICD-10-CM | POA: Diagnosis not present

## 2019-08-05 DIAGNOSIS — Z01419 Encounter for gynecological examination (general) (routine) without abnormal findings: Secondary | ICD-10-CM | POA: Diagnosis not present

## 2019-08-05 DIAGNOSIS — Z6826 Body mass index (BMI) 26.0-26.9, adult: Secondary | ICD-10-CM | POA: Diagnosis not present

## 2019-08-06 ENCOUNTER — Other Ambulatory Visit: Payer: Self-pay | Admitting: Obstetrics and Gynecology

## 2019-08-06 DIAGNOSIS — Z803 Family history of malignant neoplasm of breast: Secondary | ICD-10-CM

## 2019-08-21 ENCOUNTER — Telehealth: Payer: Self-pay

## 2019-08-21 DIAGNOSIS — K862 Cyst of pancreas: Secondary | ICD-10-CM

## 2019-08-21 DIAGNOSIS — R935 Abnormal findings on diagnostic imaging of other abdominal regions, including retroperitoneum: Secondary | ICD-10-CM

## 2019-08-21 NOTE — Telephone Encounter (Signed)
Spoke with [facility] - they were not able to add the MR/MRCP of the abdomen, pancreatic protocol to patient's existing MRI of the breast scheduled 08/26/19.  I scheduled her for 09/13/19 at 2:00pm.  She is on the cancellation list for 08-26-19 in case someone cancels and they could add the additional imaging.  Sent her a Pharmacist, community message with all this information.

## 2019-08-26 ENCOUNTER — Other Ambulatory Visit: Payer: Self-pay

## 2019-08-26 ENCOUNTER — Ambulatory Visit
Admission: RE | Admit: 2019-08-26 | Discharge: 2019-08-26 | Disposition: A | Discharge status: Home / Self Care | Payer: No Typology Code available for payment source | Source: Ambulatory Visit | Attending: Obstetrics and Gynecology | Admitting: Obstetrics and Gynecology

## 2019-08-26 DIAGNOSIS — Z803 Family history of malignant neoplasm of breast: Secondary | ICD-10-CM

## 2019-08-26 MED ORDER — GADOBUTROL 1 MMOL/ML IV SOLN
6.0000 mL | Freq: Once | INTRAVENOUS | Status: AC | PRN
  Administered 2019-08-26: 6 mL via INTRAVENOUS

## 2019-08-26 NOTE — Telephone Encounter (Signed)
Patient sent Mychart message asking if both imaging could be done on January 22.  I encouraged patient to call [facility] to see if this was possible.

## 2019-08-28 ENCOUNTER — Other Ambulatory Visit: Payer: BLUE CROSS/BLUE SHIELD

## 2019-09-13 ENCOUNTER — Ambulatory Visit
Admission: RE | Admit: 2019-09-13 | Discharge: 2019-09-13 | Disposition: A | Discharge status: Home / Self Care | Payer: BC Managed Care – PPO | Source: Ambulatory Visit | Attending: Internal Medicine | Admitting: Internal Medicine

## 2019-09-13 ENCOUNTER — Other Ambulatory Visit: Payer: Self-pay

## 2019-09-13 DIAGNOSIS — R935 Abnormal findings on diagnostic imaging of other abdominal regions, including retroperitoneum: Secondary | ICD-10-CM

## 2019-09-13 DIAGNOSIS — K862 Cyst of pancreas: Secondary | ICD-10-CM

## 2019-09-13 MED ORDER — GADOBENATE DIMEGLUMINE 529 MG/ML IV SOLN
13.0000 mL | Freq: Once | INTRAVENOUS | Status: AC | PRN
  Administered 2019-09-13: 13 mL via INTRAVENOUS

## 2019-10-06 ENCOUNTER — Ambulatory Visit: Payer: BC Managed Care – PPO | Attending: Internal Medicine

## 2019-10-06 DIAGNOSIS — Z23 Encounter for immunization: Secondary | ICD-10-CM | POA: Insufficient documentation

## 2019-10-06 NOTE — Progress Notes (Signed)
   Covid-19 Vaccination Clinic  Name:  Beth Chapman    MRN: [id number] DOB: 1950-11-14  10/06/2019  [name] was observed post Covid-19 immunization for 15 minutes without incidence. She was provided with Vaccine Information Sheet and instruction to access the V-Safe system.   [name] was instructed to call 911 with any severe reactions post vaccine: Marland Kitchen Difficulty breathing  . Swelling of your face and throat  . A fast heartbeat  . A bad rash all over your body  . Dizziness and weakness    Immunizations Administered    Name Date Dose VIS Date Route   Pfizer COVID-19 Vaccine 10/06/2019  1:21 PM 0.3 mL 08/02/2019 Intramuscular   Manufacturer: [employer], [employer]   Lot: [id number]   NDC: [address]-3

## 2019-10-29 ENCOUNTER — Ambulatory Visit: Payer: BC Managed Care – PPO | Attending: Internal Medicine

## 2019-10-29 DIAGNOSIS — Z23 Encounter for immunization: Secondary | ICD-10-CM | POA: Insufficient documentation

## 2019-10-29 NOTE — Progress Notes (Signed)
   Covid-19 Vaccination Clinic  Name:  Beth Chapman    MRN: [id number] DOB: 1951-02-27  10/29/2019  [name] was observed post Covid-19 immunization for 15 minutes without incident. She was provided with Vaccine Information Sheet and instruction to access the V-Safe system.   [name] was instructed to call 911 with any severe reactions post vaccine: Marland Kitchen Difficulty breathing  . Swelling of face and throat  . A fast heartbeat  . A bad rash all over body  . Dizziness and weakness   Immunizations Administered    Name Date Dose VIS Date Route   Pfizer COVID-19 Vaccine 10/29/2019  6:19 PM 0.3 mL 08/02/2019 Intramuscular   Manufacturer: [employer], [employer]   Lot: [id number]   NDC: [address]-5

## 2019-10-30 ENCOUNTER — Ambulatory Visit: Payer: No Typology Code available for payment source

## 2020-05-08 DIAGNOSIS — Z1231 Encounter for screening mammogram for malignant neoplasm of breast: Secondary | ICD-10-CM | POA: Diagnosis not present

## 2020-05-21 ENCOUNTER — Encounter: Payer: Self-pay | Admitting: Internal Medicine

## 2020-06-04 IMAGING — CR DG THORACIC SPINE 3V
3 series · 3 of 3 positions shown · non-contrast
Comparison: None.

CLINICAL DATA: Back pain.

EXAM:
THORACIC SPINE - 3 VIEWS

[w thoracic spine ap]
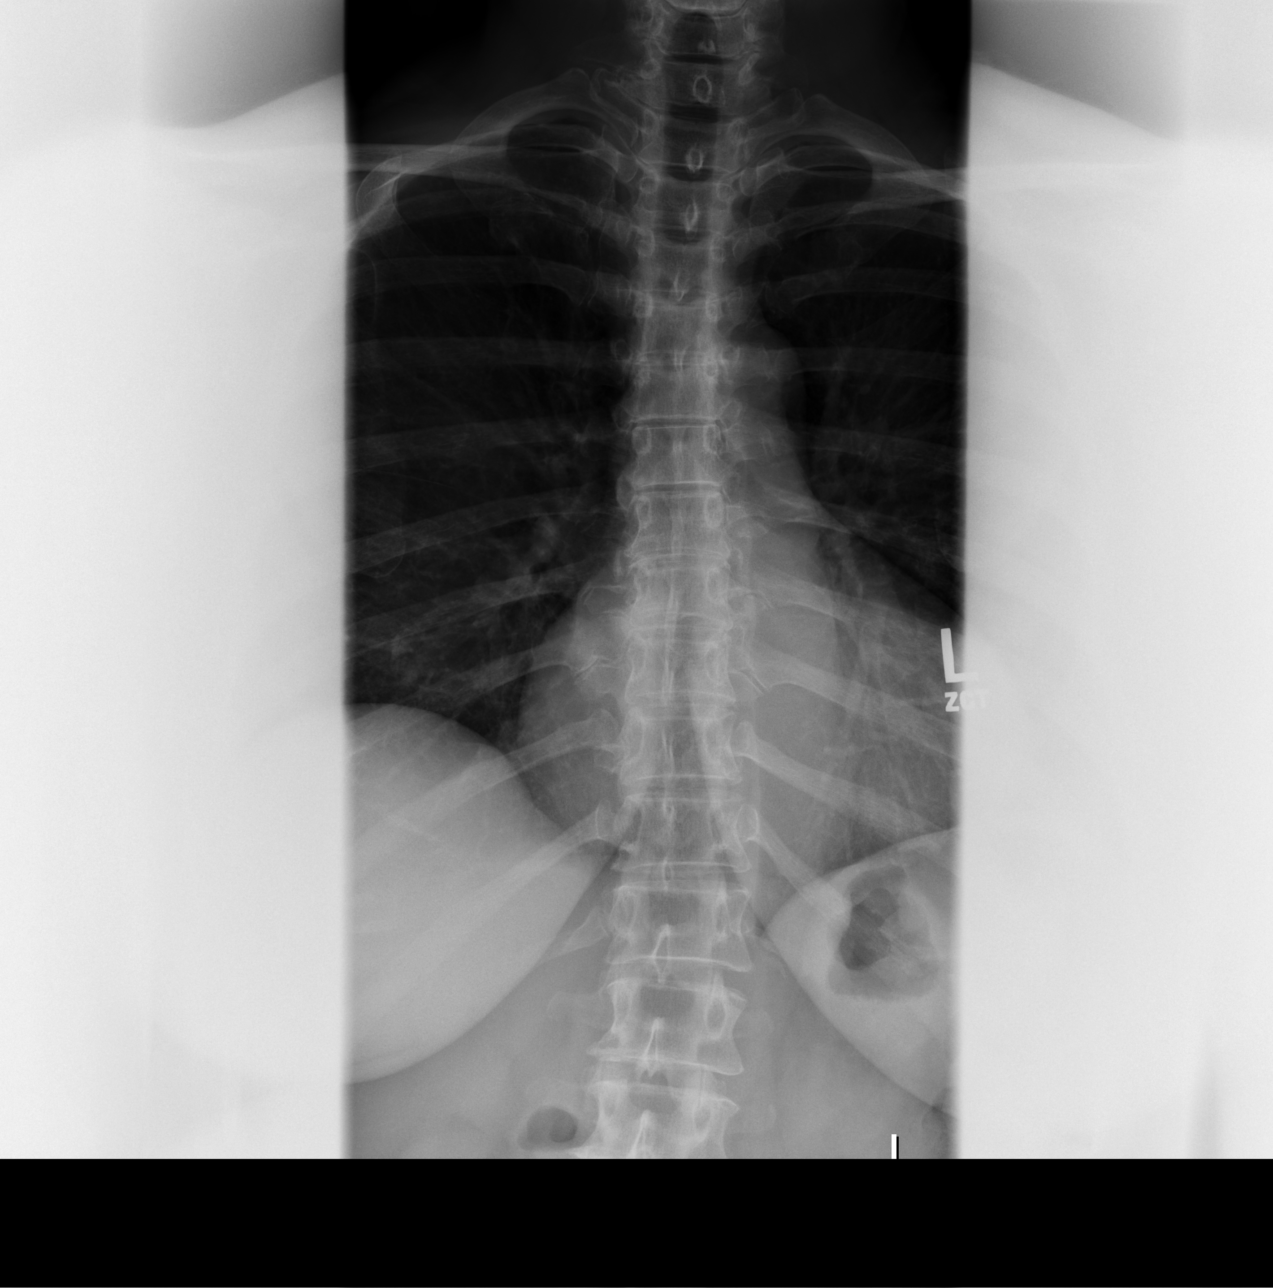

[w thoracic spine lat]
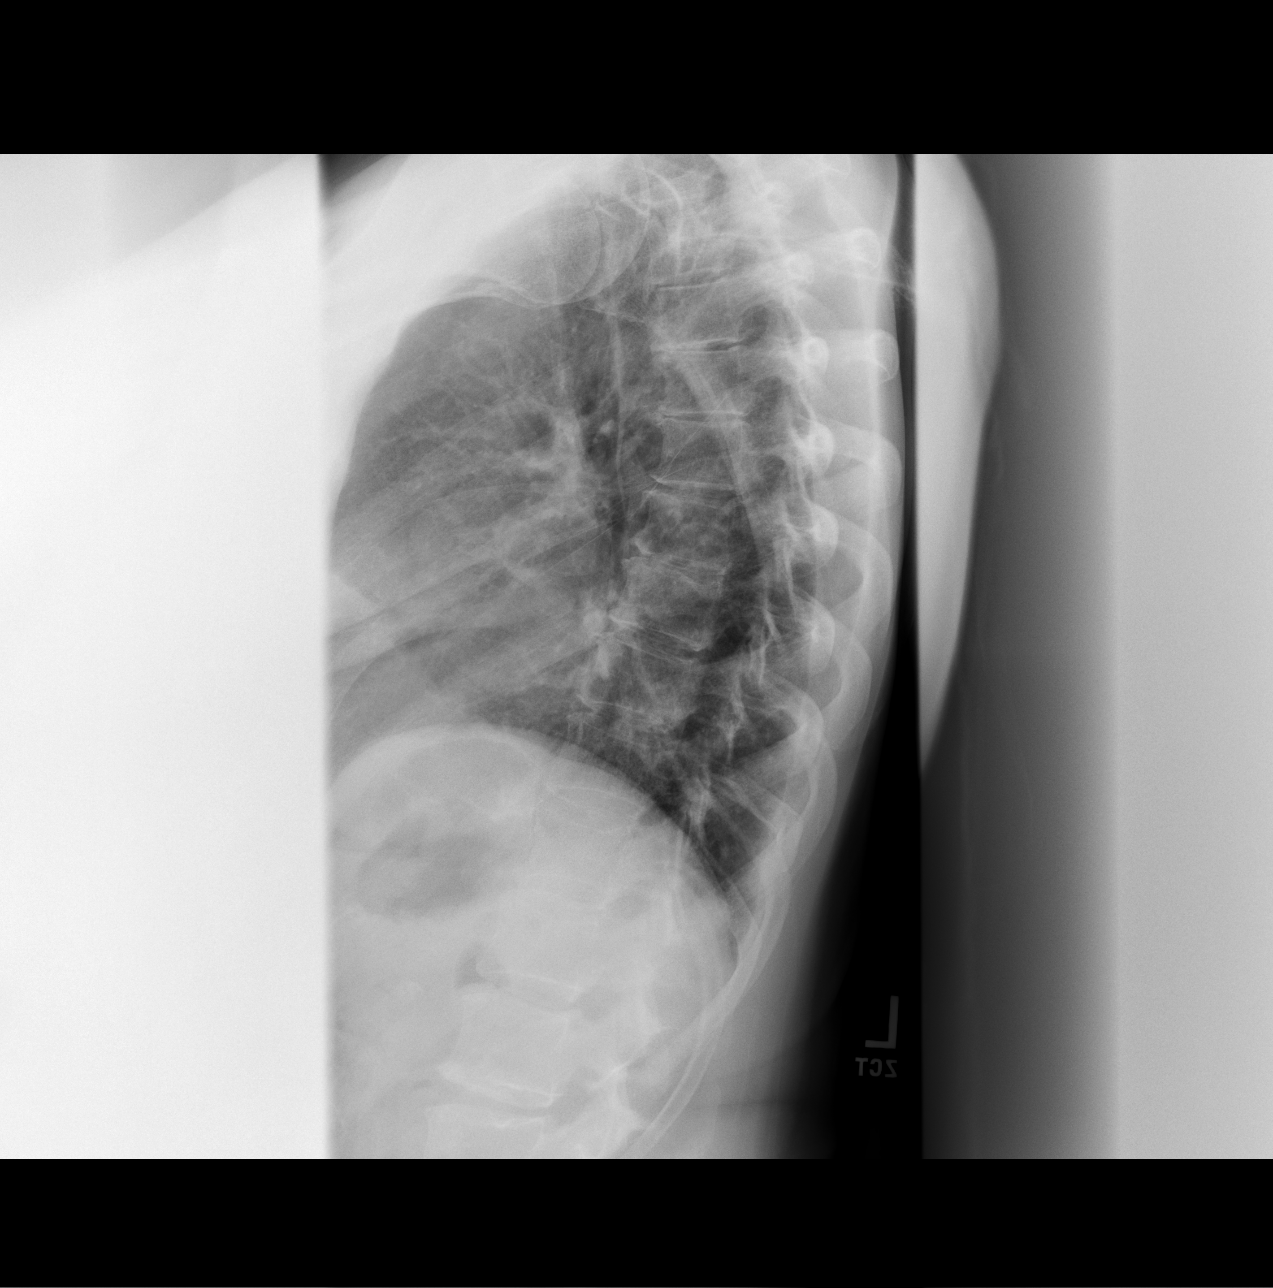

[w thoracic swimmers]
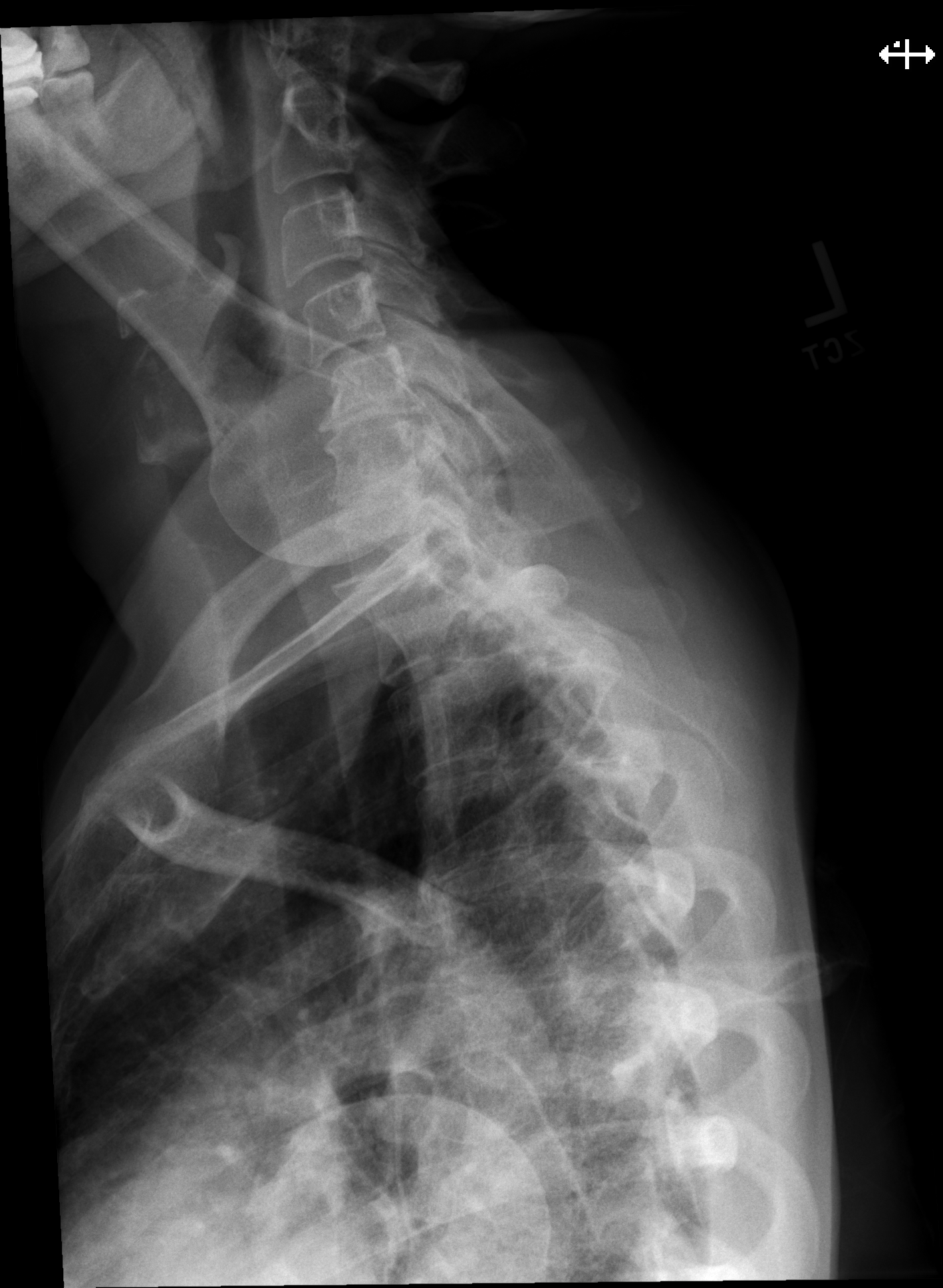

[3 of 3 positions shown; findings below may reference images not displayed]

FINDINGS: Twelve rib-bearing thoracic vertebral bodies. Hypoplastic ribs at
T12. No acute fracture or subluxation. Vertebral body heights are
preserved. Alignment is normal. Mild disc height loss and anterior
endplate spurring in the mid to lower thoracic spine.
IMPRESSION: 1. Mild thoracic spondylosis.

## 2020-06-08 IMAGING — CR DG CHEST 2V
2 series · 2 of 2 positions shown · non-contrast
Comparison: None.

CLINICAL DATA: Chest pain

EXAM:
CHEST - 2 VIEW

[w chest pa]
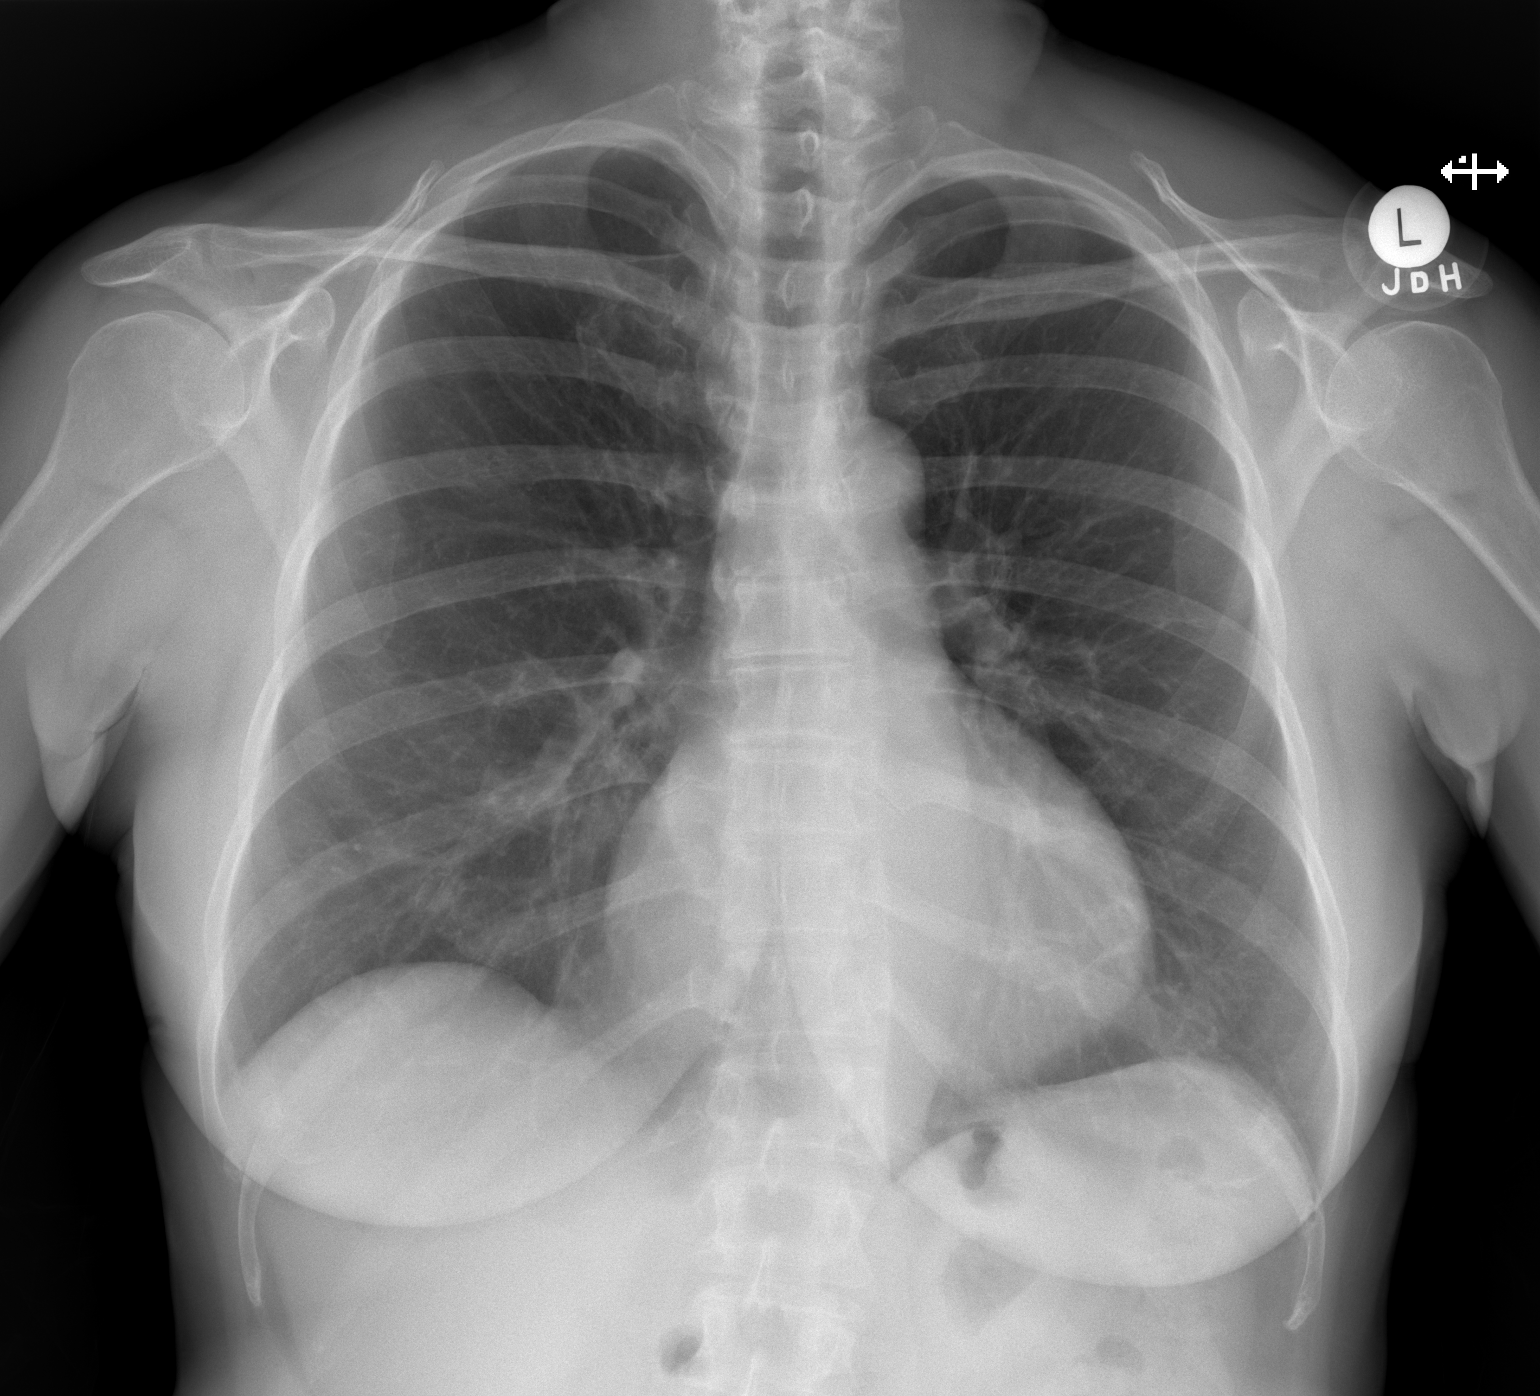

[w chest lat]
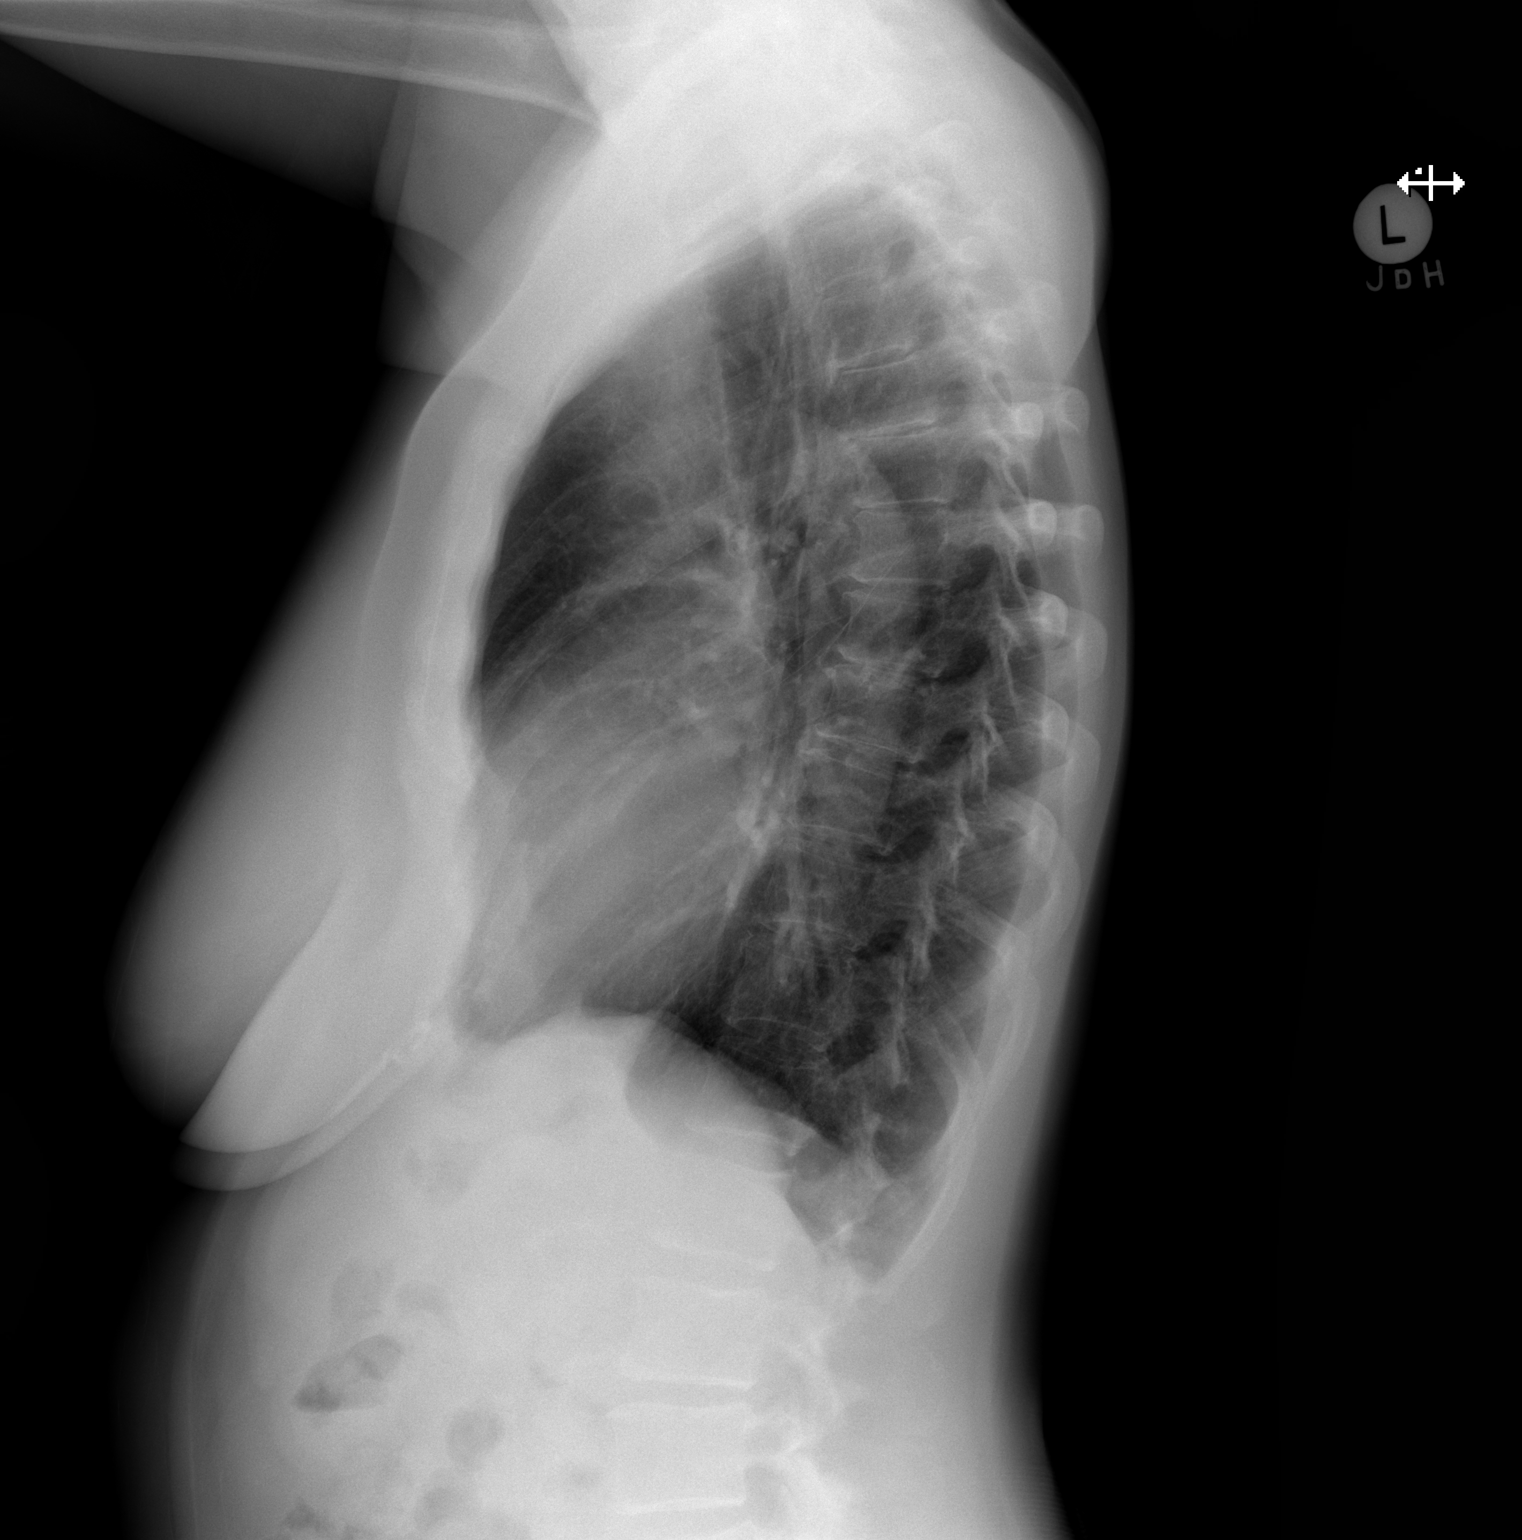

[2 of 2 positions shown; findings below may reference images not displayed]

FINDINGS: Lungs are clear. Heart size and pulmonary vascularity are normal. No
adenopathy. No pneumothorax. No bone lesions.
IMPRESSION: No edema or consolidation.

## 2020-07-10 ENCOUNTER — Ambulatory Visit (AMBULATORY_SURGERY_CENTER): Payer: Self-pay

## 2020-07-10 ENCOUNTER — Other Ambulatory Visit: Payer: Self-pay

## 2020-07-10 VITALS — Ht 62.0 in | Wt 143.6 lb

## 2020-07-10 DIAGNOSIS — Z1211 Encounter for screening for malignant neoplasm of colon: Secondary | ICD-10-CM

## 2020-07-10 MED ORDER — SUTAB 1479-225-188 MG PO TABS: 1.0000 | ORAL_TABLET | ORAL | 0 refills | Status: DC

## 2020-07-10 NOTE — Progress Notes (Signed)
Denies allergies to eggs or soy products. Denies complication of anesthesia or sedation. Denies use of weight loss medication. Denies use of O2.   Emmi instructions given for colonoscopy.  Patient completed Covid vaccinations 10/29/19.

## 2020-07-24 ENCOUNTER — Encounter: Payer: Self-pay | Admitting: Internal Medicine

## 2020-07-24 ENCOUNTER — Other Ambulatory Visit: Payer: Self-pay

## 2020-07-24 ENCOUNTER — Ambulatory Visit (AMBULATORY_SURGERY_CENTER): Payer: PPO | Admitting: Internal Medicine

## 2020-07-24 VITALS — BP 130/58 | HR 66 | Temp 97.3°F | Resp 15 | Ht 62.0 in | Wt 143.6 lb

## 2020-07-24 DIAGNOSIS — Z800 Family history of malignant neoplasm of digestive organs: Secondary | ICD-10-CM | POA: Diagnosis not present

## 2020-07-24 DIAGNOSIS — Z1211 Encounter for screening for malignant neoplasm of colon: Secondary | ICD-10-CM

## 2020-07-24 MED ORDER — SODIUM CHLORIDE 0.9 % IV SOLN: 500.0000 mL | Freq: Once | INTRAVENOUS | Status: AC

## 2020-07-24 NOTE — Progress Notes (Signed)
A and O x3. Report to RN. Tolerated MAC anesthesia well.

## 2020-07-24 NOTE — Patient Instructions (Signed)
Discharge instructions given. °Handout on Hemorrhoids. °Resume previous medications. °YOU HAD AN ENDOSCOPIC PROCEDURE TODAY AT THE [facility]:   Refer to the procedure report that was given to you for any specific questions about what was found during the examination.  If the procedure report does not answer your questions, please call your gastroenterologist to clarify.  If you requested that your care partner not be given the details of your procedure findings, then the procedure report has been included in a sealed envelope for you to review at your convenience later. ° °YOU SHOULD EXPECT: Some feelings of bloating in the abdomen. Passage of more gas than usual.  Walking can help get rid of the air that was put into your GI tract during the procedure and reduce the bloating. If you had a lower endoscopy (such as a colonoscopy or flexible sigmoidoscopy) you may notice spotting of blood in your stool or on the toilet paper. If you underwent a bowel prep for your procedure, you may not have a normal bowel movement for a few days. ° °Please Note:  You might notice some irritation and congestion in your nose or some drainage.  This is from the oxygen used during your procedure.  There is no need for concern and it should clear up in a day or so. ° °SYMPTOMS TO REPORT IMMEDIATELY: ° °Following lower endoscopy (colonoscopy or flexible sigmoidoscopy): ° Excessive amounts of blood in the stool ° Significant tenderness or worsening of abdominal pains ° Swelling of the abdomen that is new, acute ° Fever of 100°F or higher ° ° °For urgent or emergent issues, a gastroenterologist can be reached at any hour by calling [phone]. °Do not use MyChart messaging for urgent concerns.  ° ° °DIET:  We do recommend a small meal at first, but then you may proceed to your regular diet.  Drink plenty of fluids but you should avoid alcoholic beverages for 24 hours. ° °ACTIVITY:  You should plan to take it easy for the  rest of today and you should NOT DRIVE or use heavy machinery until tomorrow (because of the sedation medicines used during the test).   ° °FOLLOW UP: °Our staff will call the number listed on your records 48-72 hours following your procedure to check on you and address any questions or concerns that you may have regarding the information given to you following your procedure. If we do not reach you, we will leave a message.  We will attempt to reach you two times.  During this call, we will ask if you have developed any symptoms of COVID 19. If you develop any symptoms (ie: fever, flu-like symptoms, shortness of breath, cough etc.) before then, please call [phone].  If you test positive for Covid 19 in the 2 weeks post procedure, please call and report this information to us.   ° °If any biopsies were taken you will be contacted by phone or by letter within the next 1-3 weeks.  Please call us at [phone] if you have not heard about the biopsies in 3 weeks.  ° ° °SIGNATURES/CONFIDENTIALITY: °You and/or your care partner have signed paperwork which will be entered into your electronic medical record.  These signatures attest to the fact that that the information above on your After Visit Summary has been reviewed and is understood.  Full responsibility of the confidentiality of this discharge information lies with you and/or your care-partner.  °

## 2020-07-24 NOTE — Progress Notes (Signed)
VS by CW  I have reviewed the patient's medical history in detail and updated the computerized patient record.  

## 2020-07-24 NOTE — Op Note (Signed)
[facility] Patient Name: Beth Chapman Procedure Date: 07/24/2020 8:02 AM MRN: [id number] Endoscopist: Wilhemina Bonito. Marina Goodell , MD Age: 69 Referring MD:  Date of Birth: August 25, 1950 Gender: Female Account #: [id number] Procedure:                Colonoscopy Indications:              Screening for colorectal malignant neoplasm.                            Previous examinations 2004 and 2011 were both                            negative for neoplasia Medicines:                Monitored Anesthesia Care Procedure:                Pre-Anesthesia Assessment:                           - Prior to the procedure, a History and Physical                            was performed, and patient medications and                            allergies were reviewed. The patient's tolerance of                            previous anesthesia was also reviewed. The risks                            and benefits of the procedure and the sedation                            options and risks were discussed with the patient.                            All questions were answered, and informed consent                            was obtained. Prior Anticoagulants: The patient has                            taken no previous anticoagulant or antiplatelet                            agents. ASA Grade Assessment: II - A patient with                            mild systemic disease. After reviewing the risks                            and benefits, the patient was deemed in  satisfactory condition to undergo the procedure.                           After obtaining informed consent, the colonoscope                            was passed under direct vision. Throughout the                            procedure, the patient's blood pressure, pulse, and                            oxygen saturations were monitored continuously. The                            Colonoscope was introduced through the anus  and                            advanced to the the cecum, identified by                            appendiceal orifice and ileocecal valve. The                            ileocecal valve, appendiceal orifice, and rectum                            were photographed. The quality of the bowel                            preparation was excellent. The colonoscopy was                            performed without difficulty. The patient tolerated                            the procedure well. The bowel preparation used was                            SUPREP via split dose instruction. Scope In: 8:28:54 AM Scope Out: 8:41:32 AM Scope Withdrawal Time: 0 hours 7 minutes 56 seconds  Total Procedure Duration: 0 hours 12 minutes 38 seconds  Findings:                 The entire examined colon appeared normal on direct                            and retroflexion views. Small internal hemorrhoids                            present Complications:            No immediate complications. Estimated blood loss:  None. Estimated Blood Loss:     Estimated blood loss: none. Impression:               - The entire examined colon is normal on direct and                            retroflexion views.                           - No specimens collected. Recommendation:           - Repeat colonoscopy in 10 years for screening                            purposes.                           - Patient has a contact number available for                            emergencies. The signs and symptoms of potential                            delayed complications were discussed with the                            patient. Return to normal activities tomorrow.                            Written discharge instructions were provided to the                            patient.                           - Resume previous diet.                           - Continue present medications. Wilhemina Bonito. Marina Goodell,  MD 07/24/2020 8:48:26 AM This report has been signed electronically.

## 2020-07-28 ENCOUNTER — Telehealth: Payer: Self-pay

## 2020-07-28 NOTE — Telephone Encounter (Signed)
  Follow up Call-  Call back number 07/24/2020  Post procedure Call Back phone  # [phone]  Permission to leave phone message Yes  Some recent data might be hidden     Patient questions:  Do you have a fever, pain , or abdominal swelling? No. Pain Score  0 *  Have you tolerated food without any problems? Yes.    Have you been able to return to your normal activities? Yes.    Do you have any questions about your discharge instructions: Diet   No. Medications  No. Follow up visit  No.  Do you have questions or concerns about your Care? No.  Actions: * If pain score is 4 or above: No action needed, pain <4. 1. Have you developed a fever since your procedure? no  2.   Have you had an respiratory symptoms (SOB or cough) since your procedure? no  3.   Have you tested positive for COVID 19 since your procedure no  4.   Have you had any family members/close contacts diagnosed with the COVID 19 since your procedure?  no   If yes to any of these questions please route to Laverna Peace, RN and Karlton Lemon, RN

## 2020-08-04 IMAGING — US US ABDOMEN COMPLETE
2 series · 13 of 25 positions shown · non-contrast
Comparison: No recent prior.

CLINICAL DATA: Elevated LFTs.

EXAM:
ABDOMEN ULTRASOUND COMPLETE

[Series 1: us abdomen complete · 0.19mm/px · 12 of 89 slices shown (1 of 2)]
[im 1/89]
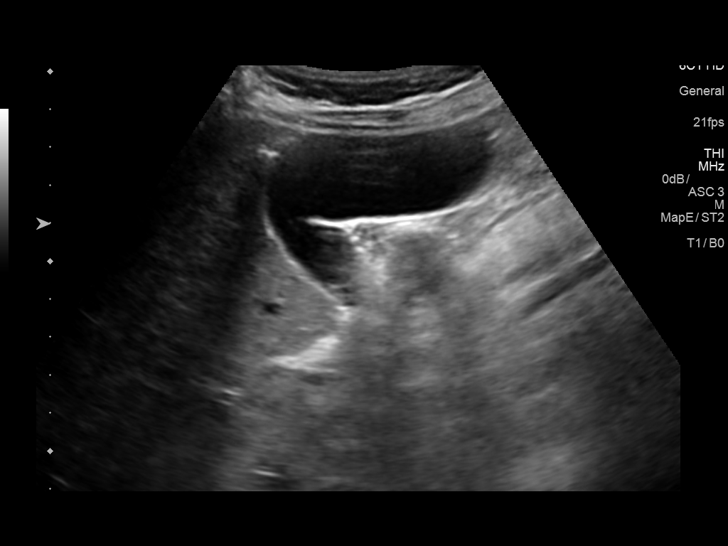
[im 8/89]
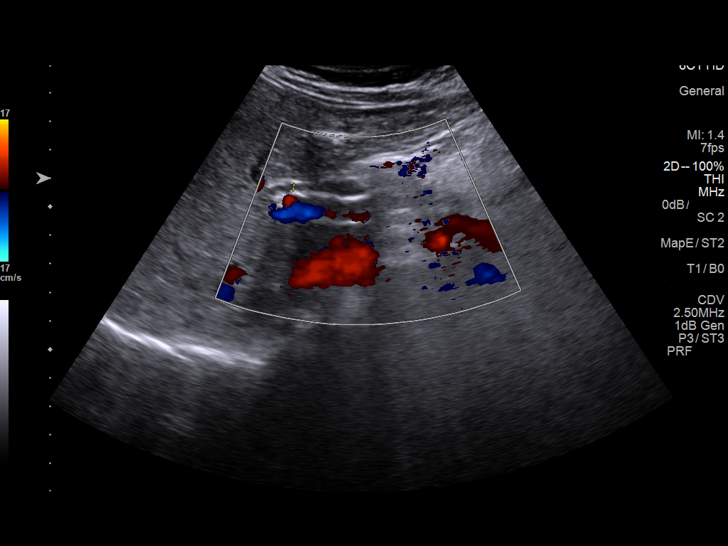
[im 16/89]
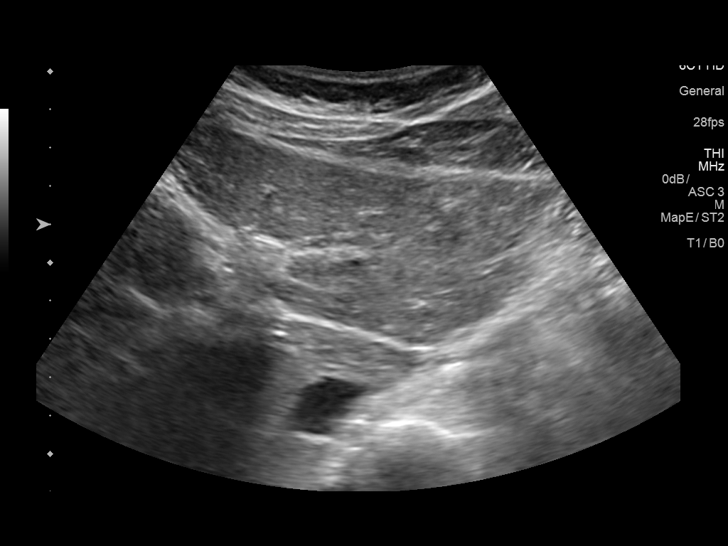
[im 23/89]
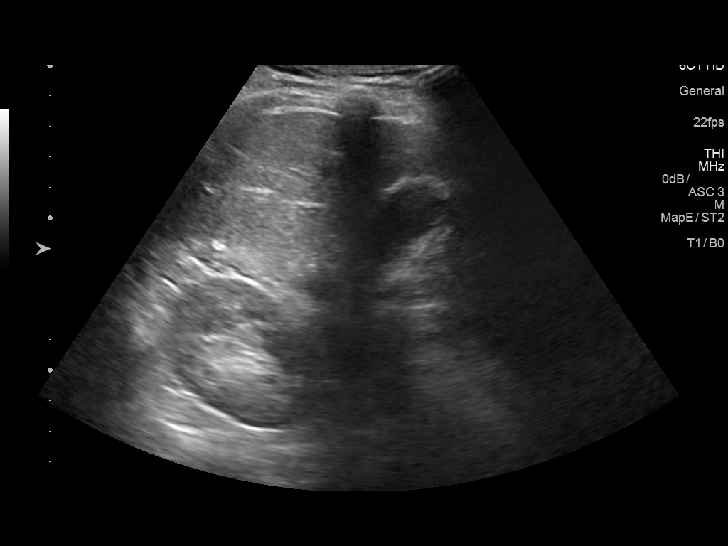
[im 31/89]
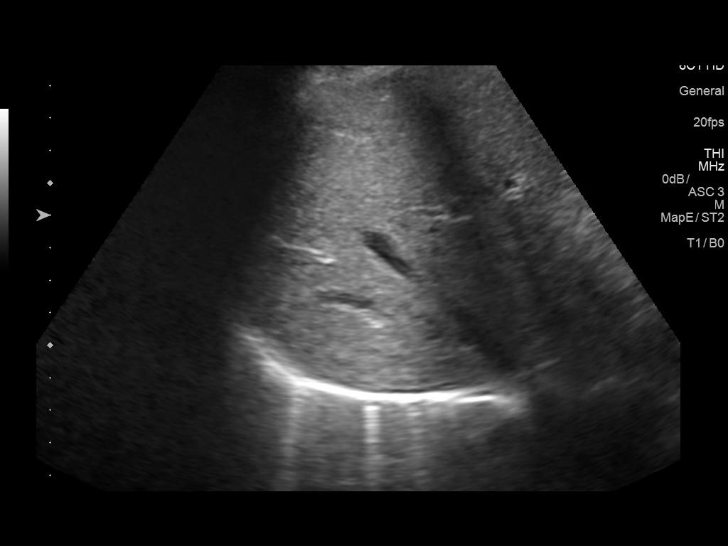
[im 39/89]
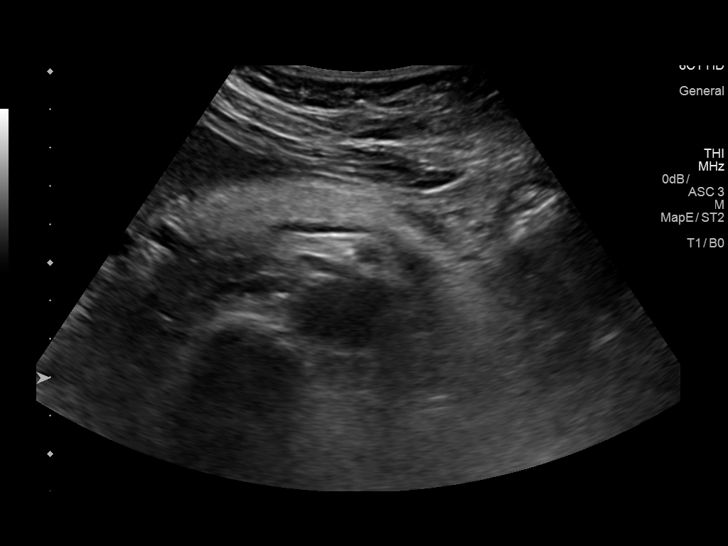
[im 46/89]
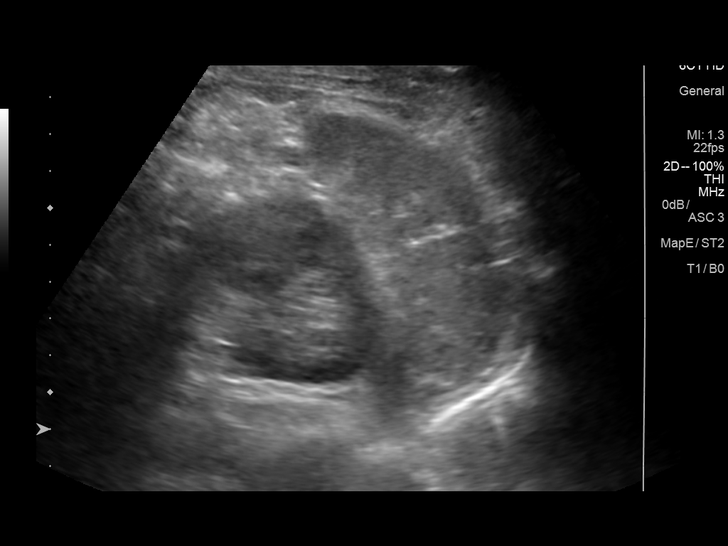
[im 54/89]
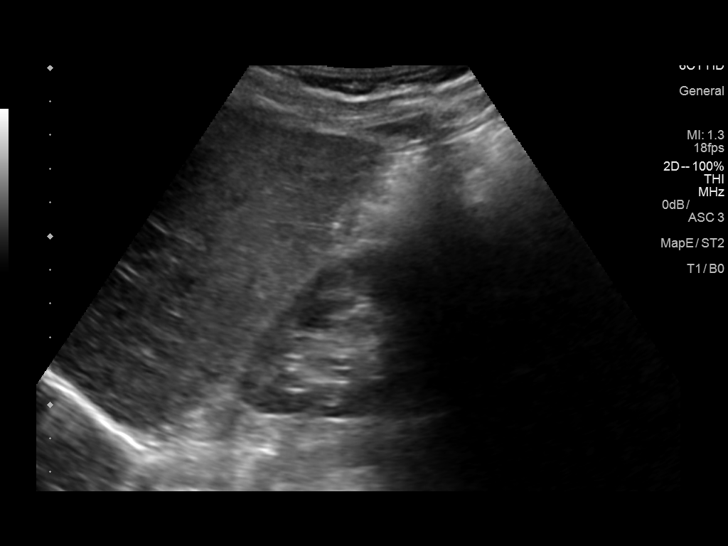
[im 62/89]
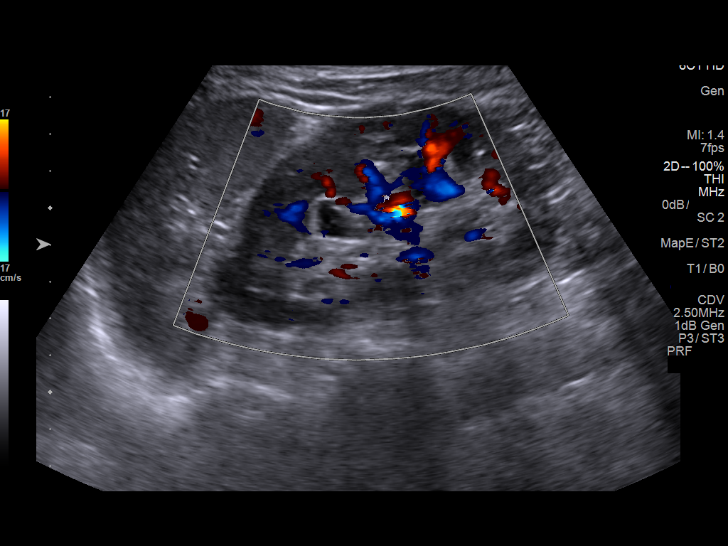
[im 69/89]
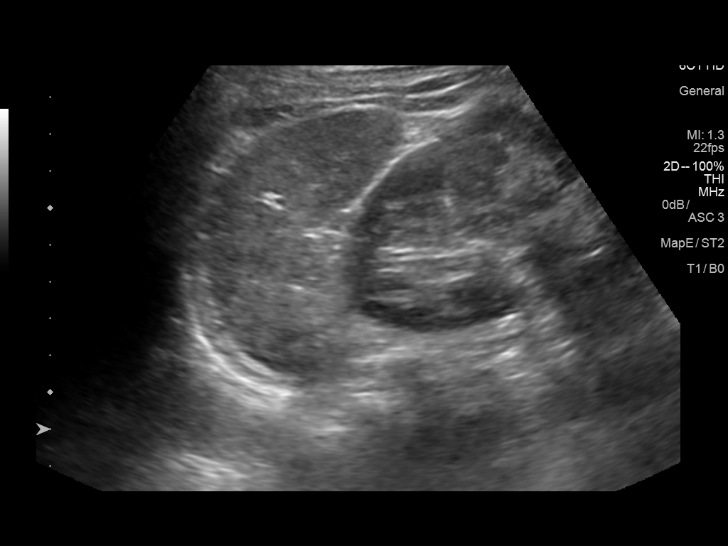
[im 77/89]
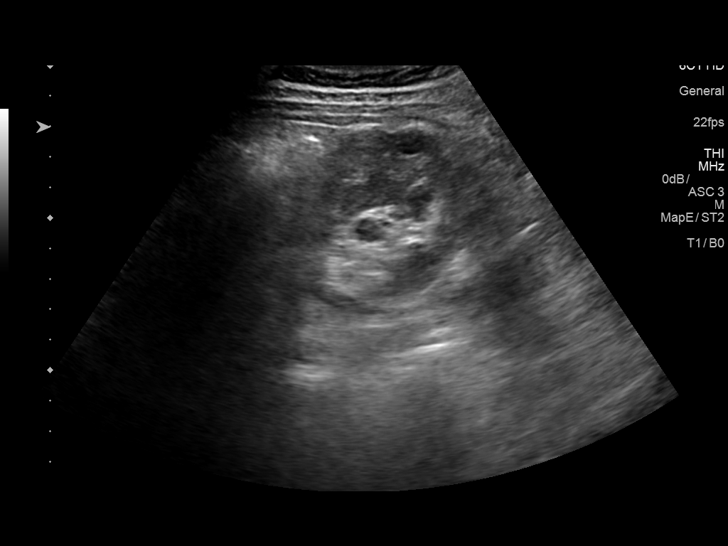
[im 85/89]
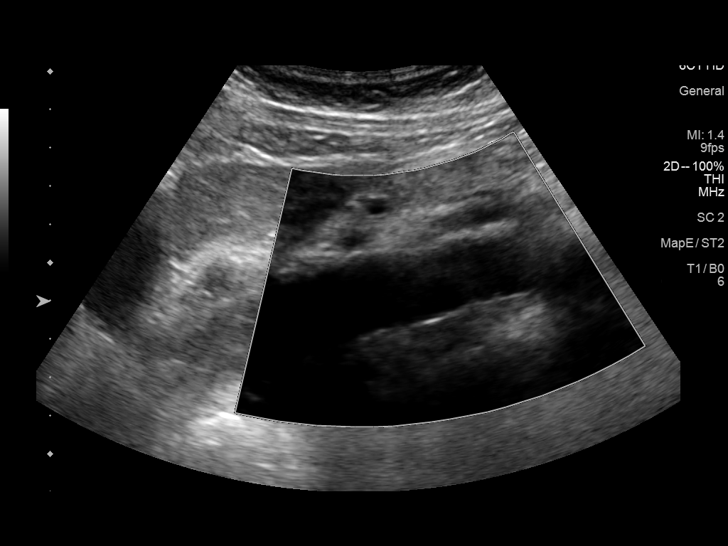

[Series 2001: us abdomen complete · 0.22mm/px · 1 of 2 slices shown (2 of 2)]
[im 1/2]
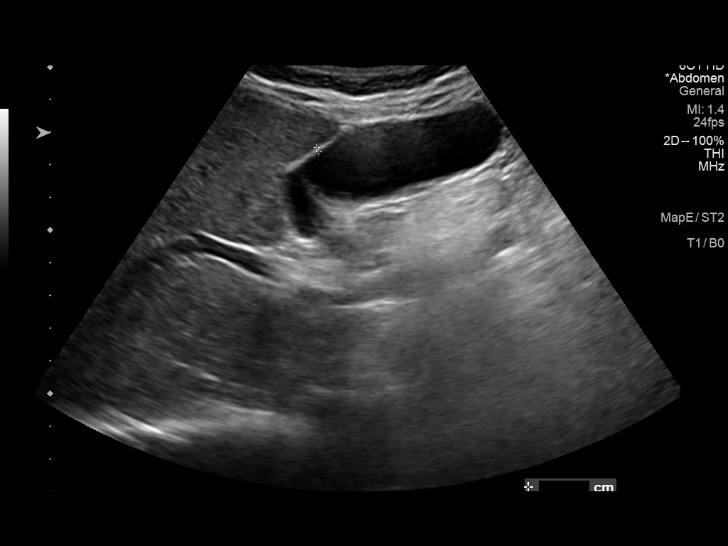

[13 of 25 positions shown; findings below may reference images not displayed]

FINDINGS: Gallbladder: No gallstones or wall thickening visualized. No
sonographic Murphy sign noted by sonographer.

Common bile duct: Diameter: 2.8 mm

Liver: No focal lesion identified. Within normal limits in
parenchymal echogenicity. Portal vein is patent on color Doppler
imaging with normal direction of blood flow towards the liver.

IVC: No abnormality visualized.

Pancreas: Visualized portion unremarkable.

Spleen: Size and appearance within normal limits.

Right Kidney: Length: 9.6 cm. Echogenicity within normal limits. No
mass or hydronephrosis visualized.

Left Kidney: Length: 9.5 cm. Echogenicity within normal limits.
Probable parapelvic cysts. A 1.2 cm hypoechoic focus with internal
echoes noted in the right kidney. For further evaluation MRI of the
kidneys suggested.. No hydronephrosis visualized.

Abdominal aorta: No aneurysm visualized.

Other findings: None.
IMPRESSION: 1. 1.2 cm hypoechoic focus with internal echoes noted in the left
kidney. This could be a solid tumor. Further evaluation MRI of the
kidneys suggested.

2. Probable left parapelvic cysts. These can also be further
evaluated with renal MRI.

## 2020-08-11 DIAGNOSIS — Z6827 Body mass index (BMI) 27.0-27.9, adult: Secondary | ICD-10-CM | POA: Diagnosis not present

## 2020-08-11 DIAGNOSIS — Z01419 Encounter for gynecological examination (general) (routine) without abnormal findings: Secondary | ICD-10-CM | POA: Diagnosis not present

## 2020-08-27 DIAGNOSIS — H524 Presbyopia: Secondary | ICD-10-CM | POA: Diagnosis not present

## 2020-08-27 DIAGNOSIS — H5212 Myopia, left eye: Secondary | ICD-10-CM | POA: Diagnosis not present

## 2020-08-27 DIAGNOSIS — H52223 Regular astigmatism, bilateral: Secondary | ICD-10-CM | POA: Diagnosis not present

## 2020-08-27 DIAGNOSIS — H40013 Open angle with borderline findings, low risk, bilateral: Secondary | ICD-10-CM | POA: Diagnosis not present

## 2020-09-16 ENCOUNTER — Other Ambulatory Visit: Payer: Self-pay

## 2020-09-16 DIAGNOSIS — M5030 Other cervical disc degeneration, unspecified cervical region: Secondary | ICD-10-CM | POA: Diagnosis not present

## 2020-09-16 DIAGNOSIS — K862 Cyst of pancreas: Secondary | ICD-10-CM | POA: Diagnosis not present

## 2020-09-16 DIAGNOSIS — Z0000 Encounter for general adult medical examination without abnormal findings: Secondary | ICD-10-CM | POA: Diagnosis not present

## 2020-09-16 DIAGNOSIS — Z79899 Other long term (current) drug therapy: Secondary | ICD-10-CM | POA: Diagnosis not present

## 2020-09-16 DIAGNOSIS — E7800 Pure hypercholesterolemia, unspecified: Secondary | ICD-10-CM | POA: Diagnosis not present

## 2020-09-18 ENCOUNTER — Telehealth: Payer: Self-pay | Admitting: Internal Medicine

## 2020-09-18 NOTE — Telephone Encounter (Signed)
Left message for pt that her MRI has already been scheduled at [facility] 2/9@7am . Pt to arrive tere at 6:30am and be NPO after midnight, Pt was sent mychart message regarding appt also,.

## 2020-09-30 ENCOUNTER — Other Ambulatory Visit: Payer: Self-pay

## 2020-09-30 ENCOUNTER — Ambulatory Visit (HOSPITAL_COMMUNITY)
Admission: RE | Admit: 2020-09-30 | Discharge: 2020-09-30 | Disposition: A | Discharge status: Home / Self Care | Payer: PPO | Source: Ambulatory Visit | Attending: Internal Medicine | Admitting: Internal Medicine

## 2020-09-30 ENCOUNTER — Other Ambulatory Visit: Payer: Self-pay | Admitting: Internal Medicine

## 2020-09-30 DIAGNOSIS — K862 Cyst of pancreas: Secondary | ICD-10-CM | POA: Diagnosis not present

## 2020-09-30 MED ORDER — GADOBUTROL 1 MMOL/ML IV SOLN
6.0000 mL | Freq: Once | INTRAVENOUS | Status: AC | PRN
  Administered 2020-09-30: 6 mL via INTRAVENOUS

## 2020-12-29 DIAGNOSIS — M7541 Impingement syndrome of right shoulder: Secondary | ICD-10-CM | POA: Diagnosis not present

## 2021-03-19 ENCOUNTER — Other Ambulatory Visit: Payer: Self-pay

## 2021-03-19 ENCOUNTER — Encounter: Payer: Self-pay | Admitting: Podiatry

## 2021-03-19 ENCOUNTER — Ambulatory Visit (INDEPENDENT_AMBULATORY_CARE_PROVIDER_SITE_OTHER): Payer: PPO | Admitting: Podiatry

## 2021-03-19 ENCOUNTER — Ambulatory Visit (INDEPENDENT_AMBULATORY_CARE_PROVIDER_SITE_OTHER): Payer: PPO

## 2021-03-19 DIAGNOSIS — M6740 Ganglion, unspecified site: Secondary | ICD-10-CM | POA: Diagnosis not present

## 2021-03-19 DIAGNOSIS — M21611 Bunion of right foot: Secondary | ICD-10-CM

## 2021-03-19 DIAGNOSIS — M21619 Bunion of unspecified foot: Secondary | ICD-10-CM

## 2021-03-19 NOTE — Progress Notes (Signed)
Subjective:   Patient ID: Beth Chapman, female   DOB: 70 y.o.   MRN: [id number]   HPI Patient presents stating she has had chronic bunion deformity right over left and also has developed a nodule on her right first metatarsal proximal to where she has the bunion.  She does not smoke states it just is popped up over the last few weeks and is tender   Review of Systems  All other systems reviewed and are negative.      Objective:  Physical Exam Vitals and nursing note reviewed.  Constitutional:      Appearance: She is well-developed.  Pulmonary:     Effort: Pulmonary effort is normal.  Musculoskeletal:        General: Normal range of motion.  Skin:    General: Skin is warm.  Neurological:     Mental Status: She is alert.    Neurovascular status found to be intact muscle strength found to be adequate range of motion adequate.  Patient is noted to have moderate hyperostosis medial aspect first metatarsal head right over left with a nodule measuring about 8 x 8 mm within subcutaneous tissue proximal to the bunion site that appears to be freely movable.  Patient is found to have good digital perfusion well oriented x3     Assessment:  Structural HAV deformity right over left along with probability for ganglionic cyst formation right  Recent duration     Plan:  H&P education rendered concerning condition and structural x-rays discussed.  Today I did a proximal nerve block sterile prep done aspirated the area getting out about half a cc gelatinous fluid and applied compression to completely immobilize it.  Advised that if it returns we may consider surgical intervention and we reviewed surgical intervention for bunion deformity which I do not recommend currently  X-rays indicate significant structural bunion elevation of the intermetatarsal angle with mild arthritis around the joint

## 2021-05-11 DIAGNOSIS — K1370 Unspecified lesions of oral mucosa: Secondary | ICD-10-CM | POA: Diagnosis not present

## 2021-05-31 DIAGNOSIS — Z1231 Encounter for screening mammogram for malignant neoplasm of breast: Secondary | ICD-10-CM | POA: Diagnosis not present

## 2021-08-12 DIAGNOSIS — Z6825 Body mass index (BMI) 25.0-25.9, adult: Secondary | ICD-10-CM | POA: Diagnosis not present

## 2021-08-12 DIAGNOSIS — Z01419 Encounter for gynecological examination (general) (routine) without abnormal findings: Secondary | ICD-10-CM | POA: Diagnosis not present

## 2021-08-17 ENCOUNTER — Other Ambulatory Visit: Payer: Self-pay | Admitting: Obstetrics and Gynecology

## 2021-08-17 DIAGNOSIS — Z8249 Family history of ischemic heart disease and other diseases of the circulatory system: Secondary | ICD-10-CM

## 2021-08-18 ENCOUNTER — Other Ambulatory Visit: Payer: Self-pay | Admitting: Obstetrics and Gynecology

## 2021-08-18 DIAGNOSIS — Z803 Family history of malignant neoplasm of breast: Secondary | ICD-10-CM

## 2021-08-26 ENCOUNTER — Ambulatory Visit
Admission: RE | Admit: 2021-08-26 | Discharge: 2021-08-26 | Disposition: A | Discharge status: Home / Self Care | Payer: No Typology Code available for payment source | Source: Ambulatory Visit | Attending: Obstetrics and Gynecology | Admitting: Obstetrics and Gynecology

## 2021-08-26 DIAGNOSIS — Z803 Family history of malignant neoplasm of breast: Secondary | ICD-10-CM

## 2021-08-26 MED ORDER — GADOBUTROL 1 MMOL/ML IV SOLN
6.0000 mL | Freq: Once | INTRAVENOUS | Status: AC | PRN
  Administered 2021-08-26: 6 mL via INTRAVENOUS

## 2021-09-14 ENCOUNTER — Ambulatory Visit
Admission: RE | Admit: 2021-09-14 | Discharge: 2021-09-14 | Disposition: A | Discharge status: Home / Self Care | Payer: No Typology Code available for payment source | Source: Ambulatory Visit | Attending: Obstetrics and Gynecology | Admitting: Obstetrics and Gynecology

## 2021-09-14 DIAGNOSIS — Z8249 Family history of ischemic heart disease and other diseases of the circulatory system: Secondary | ICD-10-CM

## 2021-09-22 DIAGNOSIS — E7800 Pure hypercholesterolemia, unspecified: Secondary | ICD-10-CM | POA: Diagnosis not present

## 2021-09-22 DIAGNOSIS — Z0000 Encounter for general adult medical examination without abnormal findings: Secondary | ICD-10-CM | POA: Diagnosis not present

## 2021-09-22 DIAGNOSIS — M542 Cervicalgia: Secondary | ICD-10-CM | POA: Diagnosis not present

## 2021-09-30 ENCOUNTER — Other Ambulatory Visit: Payer: Self-pay

## 2021-09-30 DIAGNOSIS — K862 Cyst of pancreas: Secondary | ICD-10-CM

## 2021-10-09 ENCOUNTER — Other Ambulatory Visit: Payer: Self-pay | Admitting: Internal Medicine

## 2021-10-09 ENCOUNTER — Ambulatory Visit (HOSPITAL_COMMUNITY)
Admission: RE | Admit: 2021-10-09 | Discharge: 2021-10-09 | Disposition: A | Discharge status: Home / Self Care | Payer: PPO | Source: Ambulatory Visit | Attending: Internal Medicine | Admitting: Internal Medicine

## 2021-10-09 DIAGNOSIS — J341 Cyst and mucocele of nose and nasal sinus: Secondary | ICD-10-CM | POA: Diagnosis not present

## 2021-10-09 DIAGNOSIS — R935 Abnormal findings on diagnostic imaging of other abdominal regions, including retroperitoneum: Secondary | ICD-10-CM | POA: Diagnosis not present

## 2021-10-09 DIAGNOSIS — K862 Cyst of pancreas: Secondary | ICD-10-CM

## 2021-10-09 DIAGNOSIS — N281 Cyst of kidney, acquired: Secondary | ICD-10-CM | POA: Diagnosis not present

## 2021-10-09 MED ORDER — GADOBUTROL 1 MMOL/ML IV SOLN
7.0000 mL | Freq: Once | INTRAVENOUS | Status: AC | PRN
Start: 2021-10-09 — End: 2021-10-09
  Administered 2021-10-09: 7 mL via INTRAVENOUS

## 2021-10-11 ENCOUNTER — Other Ambulatory Visit: Payer: Self-pay

## 2021-12-20 DIAGNOSIS — S29012A Strain of muscle and tendon of back wall of thorax, initial encounter: Secondary | ICD-10-CM | POA: Diagnosis not present

## 2022-03-01 DIAGNOSIS — H40013 Open angle with borderline findings, low risk, bilateral: Secondary | ICD-10-CM | POA: Diagnosis not present

## 2022-03-01 DIAGNOSIS — H5212 Myopia, left eye: Secondary | ICD-10-CM | POA: Diagnosis not present

## 2022-03-01 DIAGNOSIS — H524 Presbyopia: Secondary | ICD-10-CM | POA: Diagnosis not present

## 2022-03-01 DIAGNOSIS — H52223 Regular astigmatism, bilateral: Secondary | ICD-10-CM | POA: Diagnosis not present

## 2022-03-29 DIAGNOSIS — M25572 Pain in left ankle and joints of left foot: Secondary | ICD-10-CM | POA: Diagnosis not present

## 2022-04-19 DIAGNOSIS — S8012XA Contusion of left lower leg, initial encounter: Secondary | ICD-10-CM | POA: Diagnosis not present

## 2022-04-27 DIAGNOSIS — M79662 Pain in left lower leg: Secondary | ICD-10-CM | POA: Diagnosis not present

## 2022-05-05 DIAGNOSIS — S8012XA Contusion of left lower leg, initial encounter: Secondary | ICD-10-CM | POA: Diagnosis not present

## 2022-06-06 DIAGNOSIS — Z1231 Encounter for screening mammogram for malignant neoplasm of breast: Secondary | ICD-10-CM | POA: Diagnosis not present

## 2022-09-29 DIAGNOSIS — M545 Low back pain, unspecified: Secondary | ICD-10-CM | POA: Diagnosis not present

## 2022-10-11 ENCOUNTER — Other Ambulatory Visit: Payer: Self-pay

## 2022-10-11 DIAGNOSIS — K862 Cyst of pancreas: Secondary | ICD-10-CM

## 2022-10-20 ENCOUNTER — Other Ambulatory Visit: Payer: Self-pay | Admitting: Internal Medicine

## 2022-10-20 ENCOUNTER — Ambulatory Visit (HOSPITAL_COMMUNITY)
Admission: RE | Admit: 2022-10-20 | Discharge: 2022-10-20 | Disposition: A | Discharge status: Home / Self Care | Payer: PPO | Source: Ambulatory Visit | Attending: Internal Medicine | Admitting: Internal Medicine

## 2022-10-20 DIAGNOSIS — Z6827 Body mass index (BMI) 27.0-27.9, adult: Secondary | ICD-10-CM | POA: Diagnosis not present

## 2022-10-20 DIAGNOSIS — R935 Abnormal findings on diagnostic imaging of other abdominal regions, including retroperitoneum: Secondary | ICD-10-CM | POA: Diagnosis not present

## 2022-10-20 DIAGNOSIS — K862 Cyst of pancreas: Secondary | ICD-10-CM | POA: Insufficient documentation

## 2022-10-20 DIAGNOSIS — Z01419 Encounter for gynecological examination (general) (routine) without abnormal findings: Secondary | ICD-10-CM | POA: Diagnosis not present

## 2022-10-20 MED ORDER — GADOBUTROL 1 MMOL/ML IV SOLN
6.0000 mL | Freq: Once | INTRAVENOUS | Status: AC | PRN
  Administered 2022-10-20: 6 mL via INTRAVENOUS

## 2022-10-26 DIAGNOSIS — Z13220 Encounter for screening for lipoid disorders: Secondary | ICD-10-CM | POA: Diagnosis not present

## 2022-10-26 DIAGNOSIS — Z13228 Encounter for screening for other metabolic disorders: Secondary | ICD-10-CM | POA: Diagnosis not present

## 2022-10-26 DIAGNOSIS — Z131 Encounter for screening for diabetes mellitus: Secondary | ICD-10-CM | POA: Diagnosis not present

## 2022-10-26 DIAGNOSIS — Z1321 Encounter for screening for nutritional disorder: Secondary | ICD-10-CM | POA: Diagnosis not present

## 2022-10-26 DIAGNOSIS — Z1329 Encounter for screening for other suspected endocrine disorder: Secondary | ICD-10-CM | POA: Diagnosis not present

## 2022-10-27 ENCOUNTER — Other Ambulatory Visit (HOSPITAL_COMMUNITY): Payer: Self-pay | Admitting: Obstetrics and Gynecology

## 2022-10-27 DIAGNOSIS — R011 Cardiac murmur, unspecified: Secondary | ICD-10-CM

## 2022-11-07 DIAGNOSIS — M47816 Spondylosis without myelopathy or radiculopathy, lumbar region: Secondary | ICD-10-CM | POA: Diagnosis not present

## 2022-11-07 DIAGNOSIS — M5416 Radiculopathy, lumbar region: Secondary | ICD-10-CM | POA: Diagnosis not present

## 2022-11-10 ENCOUNTER — Ambulatory Visit (HOSPITAL_COMMUNITY)
Admission: RE | Admit: 2022-11-10 | Discharge: 2022-11-10 | Disposition: A | Discharge status: Home / Self Care | Payer: PPO | Source: Ambulatory Visit | Attending: Obstetrics and Gynecology | Admitting: Obstetrics and Gynecology

## 2022-11-10 DIAGNOSIS — I340 Nonrheumatic mitral (valve) insufficiency: Secondary | ICD-10-CM | POA: Insufficient documentation

## 2022-11-10 DIAGNOSIS — R011 Cardiac murmur, unspecified: Secondary | ICD-10-CM

## 2022-11-10 DIAGNOSIS — I341 Nonrheumatic mitral (valve) prolapse: Secondary | ICD-10-CM | POA: Insufficient documentation

## 2022-11-10 NOTE — Progress Notes (Signed)
  Echocardiogram 2D Echocardiogram has been performed.  Beth Chapman 11/10/2022, 2:14 PM

## 2022-11-11 LAB — ECHOCARDIOGRAM COMPLETE
Area-P 1/2: 2.7 cm2
Calc EF: 50.1 %
Est EF: 55
MV M vel: 5.04 m/s
MV Peak grad: 101.6 mmHg
MV VTI: 1.64 cm2
Radius: 0.7 cm
S' Lateral: 3.4 cm
Single Plane A2C EF: 44.5 %
Single Plane A4C EF: 57.7 %

## 2022-11-28 DIAGNOSIS — M545 Low back pain, unspecified: Secondary | ICD-10-CM | POA: Diagnosis not present

## 2022-12-07 DIAGNOSIS — M5416 Radiculopathy, lumbar region: Secondary | ICD-10-CM | POA: Diagnosis not present

## 2022-12-12 ENCOUNTER — Encounter: Payer: Self-pay | Admitting: Cardiovascular Disease

## 2022-12-12 ENCOUNTER — Ambulatory Visit: Payer: PPO | Attending: Cardiovascular Disease | Admitting: Cardiovascular Disease

## 2022-12-12 VITALS — BP 138/72 | HR 72 | Ht 62.0 in | Wt 142.0 lb

## 2022-12-12 DIAGNOSIS — I341 Nonrheumatic mitral (valve) prolapse: Secondary | ICD-10-CM | POA: Insufficient documentation

## 2022-12-12 DIAGNOSIS — E7800 Pure hypercholesterolemia, unspecified: Secondary | ICD-10-CM | POA: Diagnosis not present

## 2022-12-12 DIAGNOSIS — I340 Nonrheumatic mitral (valve) insufficiency: Secondary | ICD-10-CM | POA: Insufficient documentation

## 2022-12-12 NOTE — Progress Notes (Signed)
Cardiology Office Note:    Date:  12/12/2022   ID:  Beth Chapman, Beth Chapman 1951/04/11, MRN [id number]  PCP:  [name], MD   [facility] Providers Cardiologist:  None     Referring MD: Richardean Chimera, MD   No chief complaint on file. [name] is a 72 y.o. female who is being seen today for the evaluation of MVP/MR at the request of Richardean Chimera, MD.   History of Present Illness:    [name] is a 72 y.o. female with a hx of generally excellent health other than mild hypercholesterolemia, migraines, arthritis and a diagnosis of mitral valve prolapse mitral regurgitation that dates back at least to 2014.  Her primary care provider astutely noted a change in her murmur quality and ordered another echocardiogram which shows there has been substantial worsening of the mitral insufficiency.  This is now moderate to severe.  [name] is asymptomatic.  She is able to perform regular physical activity around the house in the yard and participate in water aerobics 3 times a week without any limitations from shortness of breath.  She does not have orthopnea, PND or lower extremity edema and does not have chest pain either at rest or with activity.  She denies palpitations.  She has never experienced syncope.  She is known for many years that she has hypercholesterolemia, but her elevated total and LDL cholesterol is balanced by an equally markedly elevated HDL cholesterol.  She had a coronary calcium score of zero  in January 2023 (age 38).  There is no family history of coronary artery disease, but there is a strong family history of Alzheimer's disease (father, multiple paternal aunts and uncles and even a 54 year old first cousin).  For this reason she is always been reluctant to receive a prescription for statins.  Her husband [name] is also my patient.  Past Medical History:  Diagnosis Date   Allergy    Anxiety    Arthritis    Classic migraine 11/12/2013   Heart  murmur    Migraines     Past Surgical History:  Procedure Laterality Date   LAPAROSCOPIC ABDOMINAL EXPLORATION     none      Current Medications: Current Meds  Medication Sig   acetaminophen (TYLENOL) 325 MG tablet Take 650 mg by mouth every 6 (six) hours as needed.   ALPRAZolam (XANAX) 0.25 MG tablet Take 0.25 mg by mouth as needed.   cyclobenzaprine (FLEXERIL) 10 MG tablet TAKE 1 TABLET BY MOUTH IN THE EVENING AS NEEDED   ibuprofen (ADVIL) 400 MG tablet Take 400 mg by mouth every 6 (six) hours as needed.   meloxicam (MOBIC) 15 MG tablet Take 15 mg by mouth as needed.   Multiple Vitamin (MULTIVITAMIN) tablet Take 1 tablet by mouth daily.   OVER THE COUNTER MEDICATION Vitamin D 3 50 mcg, one daily.   OVER THE COUNTER MEDICATION Calcium 500 mg. One tablet daily.   OVER THE COUNTER MEDICATION Hemp flower extract full spectrum  .50 ml once daily.   OVER THE COUNTER MEDICATION Hemp flower extract broad spectrum .50 ml once daily.   traMADol (ULTRAM) 50 MG tablet Take 50 mg by mouth at bedtime as needed.   Current Facility-Administered Medications for the 12/12/22 encounter (Office Visit) with Morgan Rennert, Rachelle Hora, MD  Medication   0.9 %  sodium chloride infusion     Allergies:   Patient has no known allergies.   Social History   Socioeconomic History  Marital status: Married    Spouse name: leonard   Number of children: 0   Years of education: college   Highest education level: Not on file  Occupational History   Occupation: [employer]  Tobacco Use   Smoking status: Never   Smokeless tobacco: Never  Substance and Sexual Activity   Alcohol use: Yes    Comment: occasionally   Drug use: No   Sexual activity: Not on file  Other Topics Concern   Not on file  Social History Narrative   Not on file   Social Determinants of Health   Financial Resource Strain: Not on file  Food Insecurity: Not on file  Transportation Needs: Not on file  Physical Activity: Not  on file  Stress: Not on file  Social Connections: Not on file     Family History: The patient's family history includes Dementia in her father; Diabetes in her brother; Heart attack in her brother; Kidney failure in her brother. There is no history of Migraines, Colon cancer, Esophageal cancer, Stomach cancer, or Rectal cancer.  ROS:   Please see the history of present illness.     All other systems reviewed and are negative.  EKGs/Labs/Other Studies Reviewed:    The following studies were reviewed today: Labs 10/27/2022  Coronary calcium score 2023  1. Coronary calcium score of 0. 2. Top-normal heart size and calcified mitral valve annulus.    Echocardiogram 2014 and 11/11/2022  1. Left ventricular ejection fraction, by estimation, is 55%. The left  ventricle has normal function. The left ventricle has no regional wall  motion abnormalities. Left ventricular diastolic parameters are  indeterminate.   2. Right ventricular systolic function is normal. The right ventricular  size is normal. There is normal pulmonary artery systolic pressure. The  estimated right ventricular systolic pressure is 22.4 mmHg.   3. Left atrial size was mildly dilated.   4. Bileaflet mitral valve prolapse with mitral annular disjunction. The  mitral valve is myxomatous. Moderate to severe mitral valve regurgitation.  No evidence of mitral stenosis.   5. The aortic valve is tricuspid. There is mild calcification of the  aortic valve. Aortic valve regurgitation is not visualized. No aortic  stenosis is present.   6. The inferior vena cava is normal in size with greater than 50%  respiratory variability, suggesting right atrial pressure of 3 mmHg.   7. Cannot exclude a small PFO.    EKG:  EKG is ordered today.  The ekg ordered today demonstrates rhythm, possible left atrial enlargement, incomplete right bundle branch block and left anterior fascicular block, QRS only 96 ms, QTc 416 ms.  A single PVC  seen on the 20 second rhythm strip.  Recent Labs: No results found for requested labs within last 365 days.  307 2024 Creatinine 0.82, potassium 4.7, hemoglobin A1c 6.1% Recent Lipid Panel No results found for: "CHOL", "TRIG", "HDL", "CHOLHDL", "VLDL", "LDLCALC", "LDLDIRECT" 10/27/2022 Total cholesterol 243, HDL 84, LDL 143, triglycerides 95   Risk Assessment/Calculations:                Physical Exam:    VS:  BP 138/72   Pulse 72   Ht  (1.575 m)   Wt 142 lb (64.4 kg)   SpO2 98%   BMI 25.97 kg/m     Wt Readings from Last 3 Encounters:  12/12/22 142 lb (64.4 kg)  07/24/20 143 lb 9.6 oz (65.1 kg)  07/10/20 143 lb 9.6 oz (65.1 kg)  GEN: Appears fit, minimally overweight, younger than stated age.  Well nourished, well developed in no acute distress HEENT: Normal NECK: No JVD; No carotid bruits LYMPHATICS: No lymphadenopathy CARDIAC: RRR, normal S1 and S2, distinct midsystolic click with an systolic murmur that radiates broadly both towards the axilla and towards the base of the heart, no diastolic murmurs, rubs, gallops RESPIRATORY:  Clear to auscultation without rales, wheezing or rhonchi  ABDOMEN: Soft, non-tender, non-distended MUSCULOSKELETAL:  No edema; No deformity  SKIN: Warm and dry NEUROLOGIC:  Alert and oriented x 3 PSYCHIATRIC:  Normal affect   ASSESSMENT:    1. Nonrheumatic mitral valve regurgitation   2. Mitral valve prolapse   3. Hypercholesterolemia    PLAN:    In order of problems listed above:  MVP/MR: The 2 echocardiograms performed 10 years apart are compared side-by-side.  There is definitely a myxomatous mitral valve with prominent prolapse of both leaflets and there has been substantial worsening of MR which is now moderate to severe.  The left ventricular systolic function is normal with an EF around 65% (in my opinion) and the chamber is not severely dilated (end-systolic diameter 3.4 cm).  There may indeed be some degree of mitral  annular disjunction, but by age 22 [name] has not had any significant arrhythmic events.  I think at this point the best approach remains monitoring with yearly echocardiograms, looking for signs of LV dysfunction or excessive dilation (end-systolic diameter 4.0 cm or higher).  She should also remain physically active and promptly report any deterioration in exercise capacity. HLP: Elevated total cholesterol and LDL cholesterol, but also with excellent HDL cholesterol and a calcium score of 0 at age 4.  She prefers not to take any lipid-lowering agents and I think that is reasonable.           Medication Adjustments/Labs and Tests Ordered: Current medicines are reviewed at length with the patient today.  Concerns regarding medicines are outlined above.  Orders Placed This Encounter  Procedures   EKG 12-Lead   ECHOCARDIOGRAM COMPLETE   No orders of the defined types were placed in this encounter.   Patient Instructions  Medication Instructions:  No changes *If you need a refill on your cardiac medications before your next appointment, please call your pharmacy*  Testing/Procedures: Your physician has requested that you have an echocardiogram in 1 YEAR. Echocardiography is a painless test that uses sound waves to create images of your heart. It provides your doctor with information about the size and shape of your heart and how well your heart's chambers and valves are working. This procedure takes approximately one hour. There are no restrictions for this procedure. Please do NOT wear cologne, perfume, aftershave, or lotions (deodorant is allowed). Please arrive 15 minutes prior to your appointment time.    Follow-Up: At [facility], you and your health needs are our priority.  As part of our continuing mission to provide you with exceptional heart care, we have created designated Provider Care Teams.  These Care Teams include your primary Cardiologist (physician) and Advanced  Practice Providers (APPs -  Physician Assistants and Nurse Practitioners) who all work together to provide you with the care you need, when you need it.  We recommend signing up for the patient portal called "MyChart".  Sign up information is provided on this After Visit Summary.  MyChart is used to connect with patients for Virtual Visits (Telemedicine).  Patients are able to view lab/test results, encounter notes, upcoming appointments, etc.  Non-urgent messages can be sent to your provider as well.   To learn more about what you can do with MyChart, go to [email or web address].    Your next appointment:   1 year(s)- After Echo  Provider:   Dr Royann Shivers      Signed, Thurmon Fair, MD  12/12/2022 2:04 PM    [facility]

## 2022-12-12 NOTE — Patient Instructions (Signed)
Medication Instructions:  No changes *If you need a refill on your cardiac medications before your next appointment, please call your pharmacy*  Testing/Procedures: Your physician has requested that you have an echocardiogram in 1 YEAR. Echocardiography is a painless test that uses sound waves to create images of your heart. It provides your doctor with information about the size and shape of your heart and how well your heart's chambers and valves are working. This procedure takes approximately one hour. There are no restrictions for this procedure. Please do NOT wear cologne, perfume, aftershave, or lotions (deodorant is allowed). Please arrive 15 minutes prior to your appointment time.    Follow-Up: At [facility], you and your health needs are our priority.  As part of our continuing mission to provide you with exceptional heart care, we have created designated Provider Care Teams.  These Care Teams include your primary Cardiologist (physician) and Advanced Practice Providers (APPs -  Physician Assistants and Nurse Practitioners) who all work together to provide you with the care you need, when you need it.  We recommend signing up for the patient portal called "MyChart".  Sign up information is provided on this After Visit Summary.  MyChart is used to connect with patients for Virtual Visits (Telemedicine).  Patients are able to view lab/test results, encounter notes, upcoming appointments, etc.  Non-urgent messages can be sent to your provider as well.   To learn more about what you can do with MyChart, go to [email or web address].    Your next appointment:   1 year(s)- After Echo  Provider:   Dr Royann Shivers

## 2022-12-14 DIAGNOSIS — M545 Low back pain, unspecified: Secondary | ICD-10-CM | POA: Diagnosis not present

## 2022-12-23 DIAGNOSIS — R7401 Elevation of levels of liver transaminase levels: Secondary | ICD-10-CM | POA: Diagnosis not present

## 2022-12-23 DIAGNOSIS — M5416 Radiculopathy, lumbar region: Secondary | ICD-10-CM | POA: Diagnosis not present

## 2022-12-28 DIAGNOSIS — M5416 Radiculopathy, lumbar region: Secondary | ICD-10-CM | POA: Diagnosis not present

## 2023-03-23 DIAGNOSIS — N958 Other specified menopausal and perimenopausal disorders: Secondary | ICD-10-CM | POA: Diagnosis not present

## 2023-03-23 DIAGNOSIS — M8588 Other specified disorders of bone density and structure, other site: Secondary | ICD-10-CM | POA: Diagnosis not present

## 2023-03-23 DIAGNOSIS — E785 Hyperlipidemia, unspecified: Secondary | ICD-10-CM | POA: Diagnosis not present

## 2023-04-04 DIAGNOSIS — M5416 Radiculopathy, lumbar region: Secondary | ICD-10-CM | POA: Diagnosis not present

## 2023-04-04 DIAGNOSIS — M47816 Spondylosis without myelopathy or radiculopathy, lumbar region: Secondary | ICD-10-CM | POA: Diagnosis not present

## 2023-04-16 DIAGNOSIS — M545 Low back pain, unspecified: Secondary | ICD-10-CM | POA: Diagnosis not present

## 2023-04-21 DIAGNOSIS — M5416 Radiculopathy, lumbar region: Secondary | ICD-10-CM | POA: Diagnosis not present

## 2023-04-21 DIAGNOSIS — M47896 Other spondylosis, lumbar region: Secondary | ICD-10-CM | POA: Diagnosis not present

## 2023-04-21 DIAGNOSIS — M47816 Spondylosis without myelopathy or radiculopathy, lumbar region: Secondary | ICD-10-CM | POA: Diagnosis not present

## 2023-05-10 DIAGNOSIS — M47816 Spondylosis without myelopathy or radiculopathy, lumbar region: Secondary | ICD-10-CM | POA: Diagnosis not present

## 2023-05-16 DIAGNOSIS — M8588 Other specified disorders of bone density and structure, other site: Secondary | ICD-10-CM | POA: Diagnosis not present

## 2023-05-16 DIAGNOSIS — M545 Low back pain, unspecified: Secondary | ICD-10-CM | POA: Diagnosis not present

## 2023-05-16 DIAGNOSIS — R7303 Prediabetes: Secondary | ICD-10-CM | POA: Diagnosis not present

## 2023-05-16 DIAGNOSIS — G8929 Other chronic pain: Secondary | ICD-10-CM | POA: Diagnosis not present

## 2023-05-16 DIAGNOSIS — I341 Nonrheumatic mitral (valve) prolapse: Secondary | ICD-10-CM | POA: Diagnosis not present

## 2023-05-16 DIAGNOSIS — Z23 Encounter for immunization: Secondary | ICD-10-CM | POA: Diagnosis not present

## 2023-05-16 DIAGNOSIS — K862 Cyst of pancreas: Secondary | ICD-10-CM | POA: Diagnosis not present

## 2023-05-16 DIAGNOSIS — Z0000 Encounter for general adult medical examination without abnormal findings: Secondary | ICD-10-CM | POA: Diagnosis not present

## 2023-05-17 LAB — LAB REPORT - SCANNED
A1c: 6
EGFR: 83

## 2023-05-23 DIAGNOSIS — M5416 Radiculopathy, lumbar region: Secondary | ICD-10-CM | POA: Diagnosis not present

## 2023-05-23 DIAGNOSIS — M47816 Spondylosis without myelopathy or radiculopathy, lumbar region: Secondary | ICD-10-CM | POA: Diagnosis not present

## 2023-06-06 DIAGNOSIS — R748 Abnormal levels of other serum enzymes: Secondary | ICD-10-CM | POA: Diagnosis not present

## 2023-06-06 DIAGNOSIS — R7989 Other specified abnormal findings of blood chemistry: Secondary | ICD-10-CM | POA: Diagnosis not present

## 2023-06-07 DIAGNOSIS — Z23 Encounter for immunization: Secondary | ICD-10-CM | POA: Diagnosis not present

## 2023-06-07 DIAGNOSIS — R748 Abnormal levels of other serum enzymes: Secondary | ICD-10-CM | POA: Diagnosis not present

## 2023-06-08 DIAGNOSIS — Z1231 Encounter for screening mammogram for malignant neoplasm of breast: Secondary | ICD-10-CM | POA: Diagnosis not present

## 2023-07-06 DIAGNOSIS — Z23 Encounter for immunization: Secondary | ICD-10-CM | POA: Diagnosis not present

## 2023-10-26 DIAGNOSIS — Z01419 Encounter for gynecological examination (general) (routine) without abnormal findings: Secondary | ICD-10-CM | POA: Diagnosis not present

## 2023-10-26 DIAGNOSIS — Z6824 Body mass index (BMI) 24.0-24.9, adult: Secondary | ICD-10-CM | POA: Diagnosis not present

## 2023-11-16 DIAGNOSIS — M47816 Spondylosis without myelopathy or radiculopathy, lumbar region: Secondary | ICD-10-CM | POA: Diagnosis not present

## 2023-11-16 DIAGNOSIS — M5416 Radiculopathy, lumbar region: Secondary | ICD-10-CM | POA: Diagnosis not present

## 2023-11-22 DIAGNOSIS — M47816 Spondylosis without myelopathy or radiculopathy, lumbar region: Secondary | ICD-10-CM | POA: Diagnosis not present

## 2023-12-12 ENCOUNTER — Encounter: Payer: Self-pay | Admitting: Cardiovascular Disease

## 2023-12-12 ENCOUNTER — Ambulatory Visit (HOSPITAL_COMMUNITY): Payer: PPO | Attending: Cardiovascular Disease

## 2023-12-12 DIAGNOSIS — I340 Nonrheumatic mitral (valve) insufficiency: Secondary | ICD-10-CM | POA: Insufficient documentation

## 2023-12-12 DIAGNOSIS — Z01812 Encounter for preprocedural laboratory examination: Secondary | ICD-10-CM

## 2023-12-12 LAB — ECHOCARDIOGRAM COMPLETE
Area-P 1/2: 3.72 cm2
MV M vel: 5.53 m/s
MV Peak grad: 122.3 mmHg
Radius: 1.2 cm
S' Lateral: 2.9 cm

## 2023-12-14 NOTE — Telephone Encounter (Signed)
 Left message stating that the TEE has been scheduled for 01/02/24, be there at 0730. MyChart message sent with instructions/details. She will need lab work drawn no later than 12/29/23.   Left call back number for questions.

## 2023-12-15 DIAGNOSIS — M47816 Spondylosis without myelopathy or radiculopathy, lumbar region: Secondary | ICD-10-CM | POA: Diagnosis not present

## 2023-12-15 DIAGNOSIS — M7918 Myalgia, other site: Secondary | ICD-10-CM | POA: Diagnosis not present

## 2023-12-15 DIAGNOSIS — M5416 Radiculopathy, lumbar region: Secondary | ICD-10-CM | POA: Diagnosis not present

## 2023-12-27 DIAGNOSIS — Z01812 Encounter for preprocedural laboratory examination: Secondary | ICD-10-CM | POA: Diagnosis not present

## 2023-12-27 DIAGNOSIS — I340 Nonrheumatic mitral (valve) insufficiency: Secondary | ICD-10-CM | POA: Diagnosis not present

## 2023-12-28 ENCOUNTER — Encounter: Payer: Self-pay | Admitting: Cardiovascular Disease

## 2023-12-28 LAB — BASIC METABOLIC PANEL WITH GFR
BUN/Creatinine Ratio: 24 (ref 12–28)
BUN: 17 mg/dL (ref 8–27)
CO2: 22 mmol/L (ref 20–29)
Calcium: 10.2 mg/dL (ref 8.7–10.3)
Chloride: 102 mmol/L (ref 96–106)
Creatinine, Ser: 0.7 mg/dL (ref 0.57–1.00)
Glucose: 92 mg/dL (ref 70–99)
Potassium: 4.5 mmol/L (ref 3.5–5.2)
Sodium: 142 mmol/L (ref 134–144)
eGFR: 93 mL/min/{1.73_m2} (ref >59)

## 2023-12-28 LAB — CBC
Hematocrit: 45.5 % (ref 34.0–46.6)
Hemoglobin: 14.6 g/dL (ref 11.1–15.9)
MCH: 29.2 pg (ref 26.6–33.0)
MCHC: 32.1 g/dL (ref 31.5–35.7)
MCV: 91 fL (ref 79–97)
Platelets: 241 10*3/uL (ref 150–450)
RBC: 5 x10E6/uL (ref 3.77–5.28)
RDW: 12.5 % (ref 11.7–15.4)
WBC: 6.4 10*3/uL (ref 3.4–10.8)

## 2024-01-01 NOTE — Anesthesia Preprocedure Evaluation (Signed)
 Anesthesia Evaluation  Patient identified by MRN, date of birth, ID band Patient awake    Reviewed: Allergy & Precautions, H&P , NPO status , Patient's Chart, lab work & pertinent test results  Airway Mallampati: II  TM Distance: >3 FB Neck ROM: Full    Dental no notable dental hx.    Pulmonary neg pulmonary ROS   Pulmonary exam normal breath sounds clear to auscultation       Cardiovascular (-) angina (-) CAD and (-) Past MI Normal cardiovascular exam+ Valvular Problems/Murmurs MR  Rhythm:Regular Rate:Normal  11/2023 IMPRESSIONS     1. APical window is foreshortened.. Left ventricular ejection fraction,  by estimation, is 55 to 60%. The left ventricle has normal function.   2. Right ventricular systolic function is normal. The right ventricular  size is normal. There is normal pulmonary artery systolic pressure.   3. Left atrial size was moderately dilated.   4. The mitral valve is myxomatous. torrential mitral valve regurgitation.  There is mild late systolic prolapse of both leaflets of the mitral valve.   5. The aortic valve is tricuspid. Aortic valve regurgitation is not  visualized. Aortic valve sclerosis/calcification is present, without any  evidence of aortic stenosis.   6. The inferior vena cava is normal in size with greater than 50%  respiratory variability, suggesting right atrial pressure of 3 mmHg.     Neuro/Psych  Headaches, neg Seizures  Anxiety      negative psych ROS   GI/Hepatic negative GI ROS, Neg liver ROS,neg GERD  ,,  Endo/Other  negative endocrine ROS    Renal/GU negative Renal ROS  negative genitourinary   Musculoskeletal  (+) Arthritis ,    Abdominal   Peds negative pediatric ROS (+)  Hematology negative hematology ROS (+)   Anesthesia Other Findings   Reproductive/Obstetrics negative OB ROS                              Anesthesia Physical Anesthesia  Plan  ASA: 3  Anesthesia Plan: MAC   Post-op Pain Management:    Induction: Intravenous  PONV Risk Score and Plan: 1 and Propofol infusion and Treatment may vary due to age or medical condition  Airway Management Planned: Natural Airway  Additional Equipment:   Intra-op Plan:   Post-operative Plan:   Informed Consent: I have reviewed the patients History and Physical, chart, labs and discussed the procedure including the risks, benefits and alternatives for the proposed anesthesia with the patient or authorized representative who has indicated his/her understanding and acceptance.     Dental advisory given  Plan Discussed with: CRNA  Anesthesia Plan Comments:          Anesthesia Quick Evaluation

## 2024-01-01 NOTE — Progress Notes (Signed)
 Called patient with pre-procedure instructions for tomorrow.   Patient informed of:   Time to arrive for procedure. 0730 Remain NPO past midnight.  Must have a ride home and a responsible adult to remain with them for 24 hours post procedure.  Confirmed blood thinner. Confirmed no breaks in taking blood thinner for 3+ weeks prior to procedure. Confirmed patient stopped all GLP-1s and GLP-2s for at least one week before procedure.

## 2024-01-02 ENCOUNTER — Ambulatory Visit (HOSPITAL_COMMUNITY)
Admission: RE | Admit: 2024-01-02 | Discharge: 2024-01-02 | Disposition: A | Discharge status: Home / Self Care | Attending: Cardiovascular Disease | Admitting: Cardiovascular Disease

## 2024-01-02 ENCOUNTER — Other Ambulatory Visit: Payer: Self-pay

## 2024-01-02 ENCOUNTER — Ambulatory Visit (HOSPITAL_COMMUNITY): Payer: Self-pay

## 2024-01-02 ENCOUNTER — Encounter (HOSPITAL_COMMUNITY): Payer: Self-pay | Admitting: Cardiovascular Disease

## 2024-01-02 ENCOUNTER — Ambulatory Visit (HOSPITAL_COMMUNITY)
Admission: RE | Admit: 2024-01-02 | Discharge: 2024-01-02 | Disposition: A | Discharge status: Home / Self Care | Source: Ambulatory Visit | Attending: Cardiovascular Disease | Admitting: Cardiovascular Disease

## 2024-01-02 ENCOUNTER — Encounter (HOSPITAL_COMMUNITY)
Admission: RE | Disposition: A | Discharge status: Home / Self Care | Payer: Self-pay | Source: Home / Self Care | Attending: Cardiovascular Disease

## 2024-01-02 DIAGNOSIS — E7800 Pure hypercholesterolemia, unspecified: Secondary | ICD-10-CM | POA: Insufficient documentation

## 2024-01-02 DIAGNOSIS — I340 Nonrheumatic mitral (valve) insufficiency: Secondary | ICD-10-CM

## 2024-01-02 DIAGNOSIS — I361 Nonrheumatic tricuspid (valve) insufficiency: Secondary | ICD-10-CM | POA: Diagnosis not present

## 2024-01-02 DIAGNOSIS — F419 Anxiety disorder, unspecified: Secondary | ICD-10-CM | POA: Diagnosis not present

## 2024-01-02 DIAGNOSIS — I341 Nonrheumatic mitral (valve) prolapse: Secondary | ICD-10-CM | POA: Diagnosis not present

## 2024-01-02 LAB — ECHO TEE
MV M vel: 6.8 m/s
MV Peak grad: 185 mmHg
MV VTI: 1.89 cm2
Radius: 1.2 cm

## 2024-01-02 SURGERY — TRANSESOPHAGEAL ECHOCARDIOGRAM (TEE) (CATHLAB)
Anesthesia: Monitor Anesthesia Care

## 2024-01-02 MED ORDER — LIDOCAINE 2% (20 MG/ML) 5 ML SYRINGE
INTRAMUSCULAR | Status: DC | PRN
  Administered 2024-01-02: 80 mg via INTRAVENOUS

## 2024-01-02 MED ORDER — SODIUM CHLORIDE 0.9% FLUSH: 3.0000 mL | INTRAVENOUS | Status: DC | PRN

## 2024-01-02 MED ORDER — PROPOFOL 10 MG/ML IV BOLUS
INTRAVENOUS | Status: DC | PRN
  Administered 2024-01-02: 30 mg via INTRAVENOUS
  Administered 2024-01-02: 40 mg via INTRAVENOUS

## 2024-01-02 MED ORDER — PROPOFOL 500 MG/50ML IV EMUL
INTRAVENOUS | Status: DC | PRN
  Administered 2024-01-02: 100 ug/kg/min via INTRAVENOUS

## 2024-01-02 MED ORDER — SODIUM CHLORIDE 0.9% FLUSH: 3.0000 mL | Freq: Two times a day (BID) | INTRAVENOUS | Status: DC

## 2024-01-02 MED ORDER — GLYCOPYRROLATE PF 0.2 MG/ML IJ SOSY
PREFILLED_SYRINGE | INTRAMUSCULAR | Status: DC | PRN
  Administered 2024-01-02: .2 mg via INTRAVENOUS

## 2024-01-02 NOTE — Discharge Instructions (Signed)

## 2024-01-02 NOTE — Op Note (Signed)
 INDICATIONS: MR  PROCEDURE:   Informed consent was obtained prior to the procedure. The risks, benefits and alternatives for the procedure were discussed and the patient comprehended these risks.  Risks include, but are not limited to, cough, sore throat, vomiting, nausea, somnolence, esophageal and stomach trauma or perforation, bleeding, low blood pressure, aspiration, pneumonia, infection, trauma to the teeth and death.    After a procedural time-out, the oropharynx was anesthetized with 20% benzocaine spray.   During this procedure the patient was administered IV propofol by Anesthesiology, Dr. Treen.  The transesophageal probe was inserted in the esophagus and stomach without difficulty and multiple views were obtained.  The patient was kept under observation until the patient left the procedure room.  The patient left the procedure room in stable condition.   Agitated microbubble saline contrast was not administered.  COMPLICATIONS:    There were no immediate complications.  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  RECOMMENDATIONS:   Proceed with R and L heart catheterization in anticipation of referral for MV repair.  Time Spent Directly with the Patient:  30 minutes   Levada Bowersox 01/02/2024, 8:18 AM

## 2024-01-02 NOTE — H&P (Signed)
 Cardiology Admission History and Physical   Patient ID: Beth Chapman MRN: [id number]; DOB: May 29, 1951   Admission date: 01/02/2024  PCP:  Imelda Man, MD   [facility] Providers Cardiologist:  None        Chief Complaint:  MR  Patient Profile:   Beth Chapman is a 73 y.o. female with MVP who is being seen 01/02/2024 for the evaluation of MR.  History of Present Illness:   Beth Chapman is a 73 y.o. female with a hx of generally excellent health other than mild hypercholesterolemia, migraines, arthritis and a diagnosis of mitral valve prolapse mitral regurgitation that dates back at least to 2014.  Her primary care provider astutely noted a change in her murmur quality and ordered another echocardiogram which shows there has been substantial worsening of the mitral insufficiency.  This is now moderate to severe.   [name] is asymptomatic.  She is able to perform regular physical activity around the house in the yard and participate in water aerobics 3 times a week without any limitations from shortness of breath.  She does not have orthopnea, PND or lower extremity edema and does not have chest pain either at rest or with activity.  She denies palpitations.  She has never experienced syncope.   She is known for many years that she has hypercholesterolemia, but her elevated total and LDL cholesterol is balanced by an equally markedly elevated HDL cholesterol.  She had a coronary calcium score of zero  in January 2023 (age 76).  There is no family history of coronary artery disease, but there is a strong family history of Alzheimer's disease (father, multiple paternal aunts and uncles and even a 56 year old first cousin).  For this reason she is always been reluctant to receive a prescription for statins.   Her husband Dalbert Dubois is also my patient.   Past Medical History:  Diagnosis Date   Allergy    Anxiety    Arthritis    Classic migraine 11/12/2013   Heart  murmur    Migraines     Past Surgical History:  Procedure Laterality Date   LAPAROSCOPIC ABDOMINAL EXPLORATION     none       Medications Prior to Admission: Prior to Admission medications   Medication Sig Start Date End Date Taking? Authorizing Provider  acetaminophen (TYLENOL) 500 MG tablet Take 1,000 mg by mouth every 6 (six) hours as needed for moderate pain (pain score 4-6).   Yes [provider]  Cyanocobalamin (B-12 PO) Take 1 capsule by mouth daily.   Yes [provider]  cyclobenzaprine (FLEXERIL) 10 MG tablet TAKE 1 TABLET BY MOUTH IN THE EVENING AS NEEDED   Yes [provider]  fexofenadine-pseudoephedrine (ALLEGRA-D) 60-120 MG 12 hr tablet Take 1 tablet by mouth daily as needed (allergies).   Yes [provider]  MAGNESIUM GLYCINATE PO Take 2 capsules by mouth daily.   Yes [provider]  meloxicam (MOBIC) 15 MG tablet Take 15 mg by mouth daily as needed for pain.   Yes [provider]  Multiple Vitamin (MULTIVITAMIN) tablet Take 1 tablet by mouth daily.   Yes [provider]  TURMERIC-GINGER PO Take 2 capsules by mouth daily.   Yes [provider]  VITAMIN D-VITAMIN K PO Take 2 capsules by mouth daily.   Yes [provider]     Allergies:   No Known Allergies  Social History:   Social History   Socioeconomic History  Marital status: Married    Spouse name: leonard   Number of children: 0   Years of education: college   Highest education level: Not on file  Occupational History   Occupation: [employer]  Tobacco Use   Smoking status: Never   Smokeless tobacco: Never  Substance and Sexual Activity   Alcohol use: Yes    Comment: occasionally   Drug use: No   Sexual activity: Not on file  Other Topics Concern   Not on file  Social History Narrative   Not on file   Social Drivers of Health   Financial Resource Strain: Not on file  Food Insecurity: Not on file   Transportation Needs: Not on file  Physical Activity: Not on file  Stress: Not on file  Social Connections: Not on file  Intimate Partner Violence: Not on file    Family History:   The patient's family history includes Dementia in her father; Diabetes in her brother; Heart attack in her brother; Kidney failure in her brother. There is no history of Migraines, Colon cancer, Esophageal cancer, Stomach cancer, or Rectal cancer.    ROS:  Please see the history of present illness.  All other ROS reviewed and negative.     Physical Exam/Data:   Vitals:   01/02/24 0723  BP: (!) 165/86  Pulse: 81  Resp: 15  Temp: 97.8 F (36.6 C)  TempSrc: Temporal  SpO2: 96%  Weight: 59.4 kg  Height: 5\' 1"  (1.549 m)   No intake or output data in the 24 hours ending 01/02/24 0736    01/02/2024    7:23 AM 12/12/2022   12:50 PM 07/24/2020    7:30 AM  Last 3 Weights  Weight (lbs) 131 lb 142 lb 143 lb 9.6 oz  Weight (kg) 59.421 kg 64.411 kg 65.137 kg     Body mass index is 24.75 kg/m.  General:  Well nourished, well developed, in no acute distress HEENT: normal Neck: no JVD Vascular: No carotid bruits; Distal pulses 2+ bilaterally   Cardiac:  normal S1, S2; RRR; 3/6 apical holosystolic murmur  Lungs:  clear to auscultation bilaterally, no wheezing, rhonchi or rales  Abd: soft, nontender, no hepatomegaly  Ext: no edema Musculoskeletal:  No deformities, BUE and BLE strength normal and equal Skin: warm and dry  Neuro:  CNs 2-12 intact, no focal abnormalities noted Psych:  Normal affect    EKG:  The ECG that was done 4/22/224 was personally reviewed and demonstrates NSR. LAA, LAFB  Relevant CV Studies: Echo 12/12/2023   1. APical window is foreshortened.. Left ventricular ejection fraction,  by estimation, is 55 to 60%. The left ventricle has normal function.   2. Right ventricular systolic function is normal. The right ventricular  size is normal. There is normal pulmonary artery systolic  pressure.   3. Left atrial size was moderately dilated.   4. The mitral valve is myxomatous. torrential mitral valve regurgitation.  There is mild late systolic prolapse of both leaflets of the mitral valve.   5. The aortic valve is tricuspid. Aortic valve regurgitation is not  visualized. Aortic valve sclerosis/calcification is present, without any  evidence of aortic stenosis.   6. The inferior vena cava is normal in size with greater than 50%  respiratory variability, suggesting right atrial pressure of 3 mmHg.   Laboratory Data:  High Sensitivity Troponin:  No results for input(s): "TROPONINIHS" in the last 720 hours.    Chemistry Recent Labs  Lab 12/27/23 1103  NA 142  K 4.5  CL 102  CO2 22  GLUCOSE 92  BUN 17  CREATININE 0.70  CALCIUM 10.2    No results for input(s): "PROT", "ALBUMIN", "AST", "ALT", "ALKPHOS", "BILITOT" in the last 168 hours. Lipids No results for input(s): "CHOL", "TRIG", "HDL", "LABVLDL", "LDLCALC", "CHOLHDL" in the last 168 hours. Hematology Recent Labs  Lab 12/27/23 1103  WBC 6.4  RBC 5.00  HGB 14.6  HCT 45.5  MCV 91  MCH 29.2  MCHC 32.1  RDW 12.5  PLT 241   Thyroid  No results for input(s): "TSH", "FREET4" in the last 168 hours. BNPNo results for input(s): "BNP", "PROBNP" in the last 168 hours.  DDimer No results for input(s): "DDIMER" in the last 168 hours.   Radiology/Studies:  No results found.   Assessment and Plan:   MVP/MR: for more detailed evaluation by TEE with sedation today.  Informed Consent   Shared Decision Making/Informed Consent   The risks [esophageal damage, perforation (1:10,000 risk), bleeding, pharyngeal hematoma as well as other potential complications associated with conscious sedation including aspiration, arrhythmia, respiratory failure and death], benefits (treatment guidance and diagnostic support) and alternatives of a transesophageal echocardiogram were discussed in detail with Ms. Bonsignore and she is  willing to proceed.       For questions or updates, please contact [facility] Please consult [email or web address] for contact info under     Signed, Luana Rumple, MD  01/02/2024 7:36 AM

## 2024-01-02 NOTE — H&P (View-Only) (Signed)
 Cardiology Admission History and Physical   Patient ID: Beth Chapman MRN: [id number]; DOB: May 29, 1951   Admission date: 01/02/2024  PCP:  Imelda Man, MD   [facility] Providers Cardiologist:  None        Chief Complaint:  MR  Patient Profile:   Beth Chapman is a 73 y.o. female with MVP who is being seen 01/02/2024 for the evaluation of MR.  History of Present Illness:   Beth Chapman is a 73 y.o. female with a hx of generally excellent health other than mild hypercholesterolemia, migraines, arthritis and a diagnosis of mitral valve prolapse mitral regurgitation that dates back at least to 2014.  Her primary care provider astutely noted a change in her murmur quality and ordered another echocardiogram which shows there has been substantial worsening of the mitral insufficiency.  This is now moderate to severe.   [name] is asymptomatic.  She is able to perform regular physical activity around the house in the yard and participate in water aerobics 3 times a week without any limitations from shortness of breath.  She does not have orthopnea, PND or lower extremity edema and does not have chest pain either at rest or with activity.  She denies palpitations.  She has never experienced syncope.   She is known for many years that she has hypercholesterolemia, but her elevated total and LDL cholesterol is balanced by an equally markedly elevated HDL cholesterol.  She had a coronary calcium score of zero  in January 2023 (age 76).  There is no family history of coronary artery disease, but there is a strong family history of Alzheimer's disease (father, multiple paternal aunts and uncles and even a 56 year old first cousin).  For this reason she is always been reluctant to receive a prescription for statins.   Her husband Dalbert Dubois is also my patient.   Past Medical History:  Diagnosis Date   Allergy    Anxiety    Arthritis    Classic migraine 11/12/2013   Heart  murmur    Migraines     Past Surgical History:  Procedure Laterality Date   LAPAROSCOPIC ABDOMINAL EXPLORATION     none       Medications Prior to Admission: Prior to Admission medications   Medication Sig Start Date End Date Taking? Authorizing Provider  acetaminophen (TYLENOL) 500 MG tablet Take 1,000 mg by mouth every 6 (six) hours as needed for moderate pain (pain score 4-6).   Yes [provider]  Cyanocobalamin (B-12 PO) Take 1 capsule by mouth daily.   Yes [provider]  cyclobenzaprine (FLEXERIL) 10 MG tablet TAKE 1 TABLET BY MOUTH IN THE EVENING AS NEEDED   Yes [provider]  fexofenadine-pseudoephedrine (ALLEGRA-D) 60-120 MG 12 hr tablet Take 1 tablet by mouth daily as needed (allergies).   Yes [provider]  MAGNESIUM GLYCINATE PO Take 2 capsules by mouth daily.   Yes [provider]  meloxicam (MOBIC) 15 MG tablet Take 15 mg by mouth daily as needed for pain.   Yes [provider]  Multiple Vitamin (MULTIVITAMIN) tablet Take 1 tablet by mouth daily.   Yes [provider]  TURMERIC-GINGER PO Take 2 capsules by mouth daily.   Yes [provider]  VITAMIN D-VITAMIN K PO Take 2 capsules by mouth daily.   Yes [provider]     Allergies:   No Known Allergies  Social History:   Social History   Socioeconomic History  Marital status: Married    Spouse name: leonard   Number of children: 0   Years of education: college   Highest education level: Not on file  Occupational History   Occupation: [employer] manager  Tobacco Use   Smoking status: Never   Smokeless tobacco: Never  Substance and Sexual Activity   Alcohol use: Yes    Comment: occasionally   Drug use: No   Sexual activity: Not on file  Other Topics Concern   Not on file  Social History Narrative   Not on file   Social Drivers of Health   Financial Resource Strain: Not on file  Food Insecurity: Not on file   Transportation Needs: Not on file  Physical Activity: Not on file  Stress: Not on file  Social Connections: Not on file  Intimate Partner Violence: Not on file    Family History:   The patient's family history includes Dementia in her father; Diabetes in her brother; Heart attack in her brother; Kidney failure in her brother. There is no history of Migraines, Colon cancer, Esophageal cancer, Stomach cancer, or Rectal cancer.    ROS:  Please see the history of present illness.  All other ROS reviewed and negative.     Physical Exam/Data:   Vitals:   01/02/24 0723  BP: (!) 165/86  Pulse: 81  Resp: 15  Temp: 97.8 F (36.6 C)  TempSrc: Temporal  SpO2: 96%  Weight: 59.4 kg  Height: 5\' 1"  (1.549 m)   No intake or output data in the 24 hours ending 01/02/24 0736    01/02/2024    7:23 AM 12/12/2022   12:50 PM 07/24/2020    7:30 AM  Last 3 Weights  Weight (lbs) 131 lb 142 lb 143 lb 9.6 oz  Weight (kg) 59.421 kg 64.411 kg 65.137 kg     Body mass index is 24.75 kg/m.  General:  Well nourished, well developed, in no acute distress HEENT: normal Neck: no JVD Vascular: No carotid bruits; Distal pulses 2+ bilaterally   Cardiac:  normal S1, S2; RRR; 3/6 apical holosystolic murmur  Lungs:  clear to auscultation bilaterally, no wheezing, rhonchi or rales  Abd: soft, nontender, no hepatomegaly  Ext: no edema Musculoskeletal:  No deformities, BUE and BLE strength normal and equal Skin: warm and dry  Neuro:  CNs 2-12 intact, no focal abnormalities noted Psych:  Normal affect    EKG:  The ECG that was done 4/22/224 was personally reviewed and demonstrates NSR. LAA, LAFB  Relevant CV Studies: Echo 12/12/2023   1. APical window is foreshortened.. Left ventricular ejection fraction,  by estimation, is 55 to 60%. The left ventricle has normal function.   2. Right ventricular systolic function is normal. The right ventricular  size is normal. There is normal pulmonary artery systolic  pressure.   3. Left atrial size was moderately dilated.   4. The mitral valve is myxomatous. torrential mitral valve regurgitation.  There is mild late systolic prolapse of both leaflets of the mitral valve.   5. The aortic valve is tricuspid. Aortic valve regurgitation is not  visualized. Aortic valve sclerosis/calcification is present, without any  evidence of aortic stenosis.   6. The inferior vena cava is normal in size with greater than 50%  respiratory variability, suggesting right atrial pressure of 3 mmHg.   Laboratory Data:  High Sensitivity Troponin:  No results for input(s): "TROPONINIHS" in the last 720 hours.    Chemistry Recent Labs  Lab 12/27/23 1103  NA 142  K 4.5  CL 102  CO2 22  GLUCOSE 92  BUN 17  CREATININE 0.70  CALCIUM 10.2    No results for input(s): "PROT", "ALBUMIN", "AST", "ALT", "ALKPHOS", "BILITOT" in the last 168 hours. Lipids No results for input(s): "CHOL", "TRIG", "HDL", "LABVLDL", "LDLCALC", "CHOLHDL" in the last 168 hours. Hematology Recent Labs  Lab 12/27/23 1103  WBC 6.4  RBC 5.00  HGB 14.6  HCT 45.5  MCV 91  MCH 29.2  MCHC 32.1  RDW 12.5  PLT 241   Thyroid  No results for input(s): "TSH", "FREET4" in the last 168 hours. BNPNo results for input(s): "BNP", "PROBNP" in the last 168 hours.  DDimer No results for input(s): "DDIMER" in the last 168 hours.   Radiology/Studies:  No results found.   Assessment and Plan:   MVP/MR: for more detailed evaluation by TEE with sedation today.  Informed Consent   Shared Decision Making/Informed Consent   The risks [esophageal damage, perforation (1:10,000 risk), bleeding, pharyngeal hematoma as well as other potential complications associated with conscious sedation including aspiration, arrhythmia, respiratory failure and death], benefits (treatment guidance and diagnostic support) and alternatives of a transesophageal echocardiogram were discussed in detail with Ms. Bonsignore and she is  willing to proceed.       For questions or updates, please contact [facility] Please consult [email or web address] for contact info under     Signed, Luana Rumple, MD  01/02/2024 7:36 AM

## 2024-01-02 NOTE — Anesthesia Postprocedure Evaluation (Signed)
 Anesthesia Post Note  Patient: Beth Chapman  Procedure(s) Performed: TRANSESOPHAGEAL ECHOCARDIOGRAM     Patient location during evaluation: PACU Anesthesia Type: MAC Level of consciousness: awake and alert Pain management: pain level controlled Vital Signs Assessment: post-procedure vital signs reviewed and stable Respiratory status: spontaneous breathing, nonlabored ventilation, respiratory function stable and patient connected to nasal cannula oxygen Cardiovascular status: stable and blood pressure returned to baseline Postop Assessment: no apparent nausea or vomiting Anesthetic complications: no   No notable events documented.  Last Vitals:  Vitals:   01/02/24 0840 01/02/24 0850  BP: 137/87 136/81  Pulse: 88 80  Resp: 16 (!) 22  Temp:  36.6 C  SpO2: 96% 98%    Last Pain:  Vitals:   01/02/24 0850  TempSrc: Temporal  PainSc: 0-No pain                 Lethaniel Rave

## 2024-01-02 NOTE — Transfer of Care (Signed)
 Immediate Anesthesia Transfer of Care Note  Patient: Beth Chapman  Procedure(s) Performed: TRANSESOPHAGEAL ECHOCARDIOGRAM  Patient Location: PACU  Anesthesia Type:MAC  Level of Consciousness: awake, alert , and oriented  Airway & Oxygen Therapy: Patient Spontanous Breathing and Patient connected to face mask oxygen  Post-op Assessment: Report given to RN and Post -op Vital signs reviewed and stable  Post vital signs: Reviewed and stable  Last Vitals:  Vitals Value Taken Time  BP    Temp    Pulse 85 01/02/24 0822  Resp 21 01/02/24 0822  SpO2 99 % 01/02/24 0822  Vitals shown include unfiled device data.  Last Pain:  Vitals:   01/02/24 0747  TempSrc:   PainSc: 0-No pain         Complications: No notable events documented.

## 2024-01-03 ENCOUNTER — Telehealth: Payer: Self-pay | Admitting: *Deleted

## 2024-01-03 NOTE — Telephone Encounter (Signed)
 Cardiac Catheterization scheduled at [facility] for: Thursday Jan 04, 2024 9 AM Arrival time [facility] Main Entrance A at: 7 AM  Nothing to eat after midnight prior to procedure, clear liquids until 5 AM day of procedure.  Medication instructions: -Usual morning medications can be taken with sips of water including aspirin 81 mg.  Plan to go home the same day, you will only stay overnight if medically necessary.  You must have responsible adult to drive you home.  Someone must be with you the first 24 hours after you arrive home.  Reviewed procedure instructions with patient.

## 2024-01-04 ENCOUNTER — Ambulatory Visit (HOSPITAL_COMMUNITY)
Admission: RE | Admit: 2024-01-04 | Discharge: 2024-01-04 | Disposition: A | Discharge status: Home / Self Care | Attending: Cardiology | Admitting: Cardiology

## 2024-01-04 ENCOUNTER — Other Ambulatory Visit: Payer: Self-pay

## 2024-01-04 ENCOUNTER — Encounter (HOSPITAL_COMMUNITY)
Admission: RE | Disposition: A | Discharge status: Home / Self Care | Payer: Self-pay | Source: Home / Self Care | Attending: Cardiology

## 2024-01-04 ENCOUNTER — Encounter (HOSPITAL_COMMUNITY): Payer: Self-pay | Admitting: Cardiology

## 2024-01-04 DIAGNOSIS — E7800 Pure hypercholesterolemia, unspecified: Secondary | ICD-10-CM | POA: Insufficient documentation

## 2024-01-04 DIAGNOSIS — I340 Nonrheumatic mitral (valve) insufficiency: Secondary | ICD-10-CM | POA: Diagnosis not present

## 2024-01-04 DIAGNOSIS — I341 Nonrheumatic mitral (valve) prolapse: Secondary | ICD-10-CM | POA: Diagnosis not present

## 2024-01-04 SURGERY — RIGHT/LEFT HEART CATH AND CORONARY ANGIOGRAPHY
Anesthesia: LOCAL

## 2024-01-04 MED ORDER — HEPARIN SODIUM (PORCINE) 1000 UNIT/ML IJ SOLN
INTRAMUSCULAR | Status: AC
  Filled 2024-01-04: qty 10

## 2024-01-04 MED ORDER — VERAPAMIL HCL 2.5 MG/ML IV SOLN
INTRAVENOUS | Status: AC
  Filled 2024-01-04: qty 2

## 2024-01-04 MED ORDER — LIDOCAINE HCL (PF) 1 % IJ SOLN
INTRAMUSCULAR | Status: AC
  Filled 2024-01-04: qty 30

## 2024-01-04 MED ORDER — IOHEXOL 350 MG/ML SOLN
INTRAVENOUS | Status: DC | PRN
  Administered 2024-01-04: 30 mL

## 2024-01-04 MED ORDER — HEPARIN (PORCINE) IN NACL 1000-0.9 UT/500ML-% IV SOLN
INTRAVENOUS | Status: DC | PRN
  Administered 2024-01-04 (×2): 500 mL

## 2024-01-04 MED ORDER — MIDAZOLAM HCL 2 MG/2ML IJ SOLN
INTRAMUSCULAR | Status: DC | PRN
  Administered 2024-01-04: 2 mg via INTRAVENOUS

## 2024-01-04 MED ORDER — HEPARIN SODIUM (PORCINE) 1000 UNIT/ML IJ SOLN
INTRAMUSCULAR | Status: DC | PRN
  Administered 2024-01-04: 3000 [IU] via INTRAVENOUS

## 2024-01-04 MED ORDER — MIDAZOLAM HCL 2 MG/2ML IJ SOLN
INTRAMUSCULAR | Status: AC
Start: 2024-01-04 — End: ?
  Filled 2024-01-04: qty 2

## 2024-01-04 MED ORDER — ONDANSETRON HCL 4 MG/2ML IJ SOLN: 4.0000 mg | Freq: Four times a day (QID) | INTRAMUSCULAR | Status: DC | PRN

## 2024-01-04 MED ORDER — SODIUM CHLORIDE 0.9% FLUSH: 3.0000 mL | Freq: Two times a day (BID) | INTRAVENOUS | Status: DC

## 2024-01-04 MED ORDER — ASPIRIN 81 MG PO CHEW: 81.0000 mg | CHEWABLE_TABLET | ORAL | Status: DC

## 2024-01-04 MED ORDER — FENTANYL CITRATE (PF) 100 MCG/2ML IJ SOLN
INTRAMUSCULAR | Status: AC
  Filled 2024-01-04: qty 2

## 2024-01-04 MED ORDER — SODIUM CHLORIDE 0.9 % IV SOLN: 250.0000 mL | INTRAVENOUS | Status: DC | PRN

## 2024-01-04 MED ORDER — VERAPAMIL HCL 2.5 MG/ML IV SOLN
INTRAVENOUS | Status: DC | PRN
  Administered 2024-01-04: 10 mL via INTRA_ARTERIAL

## 2024-01-04 MED ORDER — FENTANYL CITRATE (PF) 100 MCG/2ML IJ SOLN
INTRAMUSCULAR | Status: DC | PRN
  Administered 2024-01-04: 25 ug via INTRAVENOUS

## 2024-01-04 MED ORDER — LIDOCAINE HCL (PF) 1 % IJ SOLN
INTRAMUSCULAR | Status: DC | PRN
  Administered 2024-01-04: 2 mL
  Administered 2024-01-04: 1 mL

## 2024-01-04 MED ORDER — ACETAMINOPHEN 325 MG PO TABS: 650.0000 mg | ORAL_TABLET | ORAL | Status: DC | PRN

## 2024-01-04 MED ORDER — SODIUM CHLORIDE 0.9 % WEIGHT BASED INFUSION: 3.0000 mL/kg/h | INTRAVENOUS | Status: AC

## 2024-01-04 MED ORDER — SODIUM CHLORIDE 0.9 % WEIGHT BASED INFUSION: 1.0000 mL/kg/h | INTRAVENOUS | Status: DC

## 2024-01-04 MED ORDER — SODIUM CHLORIDE 0.9% FLUSH: 3.0000 mL | INTRAVENOUS | Status: DC | PRN

## 2024-01-04 SURGICAL SUPPLY — 9 items
CATH 5FR JL3.5 JR4 ANG PIG MP (CATHETERS) IMPLANT
CATH BALLN WEDGE 5F 110CM (CATHETERS) IMPLANT
DEVICE RAD TR BAND REGULAR (VASCULAR PRODUCTS) IMPLANT
GLIDESHEATH SLEND SS 6F .021 (SHEATH) IMPLANT
GUIDEWIRE .025 260CM (WIRE) IMPLANT
GUIDEWIRE INQWIRE 1.5J.035X260 (WIRE) IMPLANT
PACK CARDIAC CATHETERIZATION (CUSTOM PROCEDURE TRAY) ×1 IMPLANT
SET ATX-X65L (MISCELLANEOUS) IMPLANT
SHEATH GLIDE SLENDER 4/5FR (SHEATH) IMPLANT

## 2024-01-04 NOTE — Interval H&P Note (Signed)
 History and Physical Interval Note:  01/04/2024 9:10 AM  Beth Chapman  has presented today for surgery, with the diagnosis of mr.  The various methods of treatment have been discussed with the patient and family. After consideration of risks, benefits and other options for treatment, the patient has consented to  Procedure(s): RIGHT/LEFT HEART CATH AND CORONARY ANGIOGRAPHY (N/A) as a surgical intervention.  The patient's history has been reviewed, patient examined, no change in status, stable for surgery.  I have reviewed the patient's chart and labs.  Questions were answered to the patient's satisfaction.     Donata Fryer [facility] 01/04/2024 9:10 AM

## 2024-01-04 NOTE — Discharge Instructions (Signed)

## 2024-01-04 NOTE — Progress Notes (Signed)
 Patient and wife was given discharge instructions. Both verbalized understanding.

## 2024-01-05 LAB — POCT I-STAT 7, (LYTES, BLD GAS, ICA,H+H)
Acid-base deficit: 2 mmol/L (ref 0.0–2.0)
Bicarbonate: 22.6 mmol/L (ref 20.0–28.0)
Calcium, Ion: 1.26 mmol/L (ref 1.15–1.40)
HCT: 39 % (ref 36.0–46.0)
Hemoglobin: 13.3 g/dL (ref 12.0–15.0)
O2 Saturation: 96 %
Potassium: 3.9 mmol/L (ref 3.5–5.1)
Sodium: 141 mmol/L (ref 135–145)
TCO2: 24 mmol/L (ref 22–32)
pCO2 arterial: 35.8 mmHg (ref 32–48)
pH, Arterial: 7.407 (ref 7.35–7.45)
pO2, Arterial: 82 mmHg — ABNORMAL LOW (ref 83–108)

## 2024-01-05 LAB — POCT I-STAT EG7
Acid-Base Excess: 0 mmol/L (ref 0.0–2.0)
Acid-base deficit: 1 mmol/L (ref 0.0–2.0)
Bicarbonate: 23.7 mmol/L (ref 20.0–28.0)
Bicarbonate: 24.8 mmol/L (ref 20.0–28.0)
Calcium, Ion: 1.26 mmol/L (ref 1.15–1.40)
Calcium, Ion: 1.26 mmol/L (ref 1.15–1.40)
HCT: 40 % (ref 36.0–46.0)
HCT: 40 % (ref 36.0–46.0)
Hemoglobin: 13.6 g/dL (ref 12.0–15.0)
Hemoglobin: 13.6 g/dL (ref 12.0–15.0)
O2 Saturation: 72 %
O2 Saturation: 73 %
Potassium: 4 mmol/L (ref 3.5–5.1)
Potassium: 4 mmol/L (ref 3.5–5.1)
Sodium: 142 mmol/L (ref 135–145)
Sodium: 142 mmol/L (ref 135–145)
TCO2: 25 mmol/L (ref 22–32)
TCO2: 26 mmol/L (ref 22–32)
pCO2, Ven: 40.4 mmHg — ABNORMAL LOW (ref 44–60)
pCO2, Ven: 41.6 mmHg — ABNORMAL LOW (ref 44–60)
pH, Ven: 7.377 (ref 7.25–7.43)
pH, Ven: 7.383 (ref 7.25–7.43)
pO2, Ven: 39 mmHg (ref 32–45)
pO2, Ven: 39 mmHg (ref 32–45)

## 2024-01-08 NOTE — Progress Notes (Signed)
 Cardiology Office Note:  .   Date:  01/22/2024  ID:  OREL HORD, DOB 1950-11-12, MRN [id number] PCP: Imelda Man, MD  [facility] Providers Cardiologist:  None    History of Present Illness: Aaron Aas   KAMERAN LALLIER is a 73 y.o. female with a hx of generally excellent health other than mild hypercholesterolemia, migraines, arthritis and a diagnosis of mitral valve prolapse mitral regurgitation that dates back at least to 2014. Her primary care provider astutely noted a change in her murmur quality and ordered another echocardiogram which shows there has been substantial worsening of the mitral insufficiency. This is now moderate to severe.  Patient underwent R/L heart cath that was normal and TEE that shows severe MR.Per Dr. Alvis Ba: "There is no rush to repair the valve as long as she is asymptomatic, but she may be eager to get it fixed. If so, we should refer her to [facility] to see Dr. Mindi Alto and discuss a right minithoracotomy mitral valve repair. If not, we can monitor with a transthoracic echo in 6 months."  Patient comes in with her husband. Sore right arm at cath site but improving. She tires easily. Some DOE when she walks 1 mile, doesn't have to stop but slows down. Does a slight incline. No chest pain. BP up today but there was an accident on her way here so she was running late.    ROS:    Studies Reviewed: Aaron Aas         Prior CV Studies:    Cardiac cath 01/04/24   LV end diastolic pressure is normal.   Normal coronary anatomy Normal LV filling pressures. LVEDP 16 mm Hg. PCWP 18/17, mean 14 mm Hg Normal right heart pressures. PAP 35/10, mean 20 mm Hg Normal Cardiac output 4.64 L/min, index 2.91  TEE 01/02/24 IMPRESSIONS     1. Left ventricular ejection fraction, by estimation, is 55 to 60%. The  left ventricle has normal function. The left ventricle has no regional  wall motion abnormalities.   2. Right ventricular systolic function is normal. The right  ventricular  size is normal. There is normal pulmonary artery systolic pressure. The  estimated right ventricular systolic pressure is 29.0 mmHg.   3. Left atrial size was mildly dilated. No left atrial/left atrial  appendage thrombus was detected. The LAA emptying velocity was 64 cm/s.   4. Mitral valve area is 5.3 cm sq by planimetry. Ruptured chordae seen to  the middle (P2) scallop of the poesterior mitral leaflet, with flail  motion. Posterior leaflet length is 16 mm. Mitral regurgitation vena  contracta is 5 mm. By the PISA method,  the effective regrurgitant orifice area is 0.46 cm sq, regurgitant volume  80 ml, regurgitant fraction 66%. The mitral valve is myxomatous. Severe  mitral valve regurgitation. No evidence of mitral stenosis. There is  severe prolapse of the middle scallop  of the posterior leaflet of the mitral valve. The mean mitral valve  gradient is 3.0 mmHg with average heart rate of 90 bpm.   5. The tricuspid valve is myxomatous.   6. The aortic valve is tricuspid. Aortic valve regurgitation is trivial.  No aortic stenosis is present.   7. 3D performed of the mitral valve and demonstrates prolapse of both  mitral leaflets with flail P2 scallop.    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    Risk  Assessment/Calculations:             Physical Exam:   VS:  BP 120/80   Pulse 74   Ht 5' 1.5" (1.562 m)   Wt 133 lb 9.6 oz (60.6 kg)   SpO2 98%   BMI 24.83 kg/m    Wt Readings from Last 3 Encounters:  01/22/24 133 lb 9.6 oz (60.6 kg)  01/04/24 131 lb (59.4 kg)  01/02/24 131 lb (59.4 kg)    GEN: Well nourished, well developed in no acute distress NECK: No JVD; No carotid bruits CARDIAC:  RRR,  4/6 systolic murmur apex RESPIRATORY:  Clear to auscultation without rales, wheezing or rhonchi  ABDOMEN: Soft, non-tender, non-distended EXTREMITIES:   No edema; No deformity   ASSESSMENT AND PLAN: .    Severe MR -cath normal coronary arteries. Patient with some DOE walking 1 mile. She'd like to go ahead with referral to Dr. Gaca at [facility] put through.  HLD-reluctant to take statins because of family history of alzheimer's          Dispo: f/u Dr. Tita Form in 6 months or sooner if needed.  Signed, Theotis Flake, PA-C

## 2024-01-22 ENCOUNTER — Ambulatory Visit: Attending: Physician Assistant | Admitting: Physician Assistant

## 2024-01-22 ENCOUNTER — Encounter: Payer: Self-pay | Admitting: Physician Assistant

## 2024-01-22 VITALS — BP 120/80 | HR 74 | Ht 61.5 in | Wt 133.6 lb

## 2024-01-22 DIAGNOSIS — I340 Nonrheumatic mitral (valve) insufficiency: Secondary | ICD-10-CM | POA: Diagnosis not present

## 2024-01-22 DIAGNOSIS — E7849 Other hyperlipidemia: Secondary | ICD-10-CM

## 2024-01-22 NOTE — Patient Instructions (Addendum)
 Medication Instructions:  Your physician recommends that you continue on your current medications as directed. Please refer to the Current Medication list given to you today.  *If you need a refill on your cardiac medications before your next appointment, please call your pharmacy*  Lab Work: NONE If you have labs (blood work) drawn today and your tests are completely normal, you will receive your results only by: MyChart Message (if you have MyChart) OR A paper copy in the mail If you have any lab test that is abnormal or we need to change your treatment, we will call you to review the results.  Testing/Procedures: NONE  Follow-Up: At [facility], you and your health needs are our priority.  As part of our continuing mission to provide you with exceptional heart care, our providers are all part of one team.  This team includes your primary Cardiologist (physician) and Advanced Practice Providers or APPs (Physician Assistants and Nurse Practitioners) who all work together to provide you with the care you need, when you need it.  Your next appointment:   6 MONTHS  Provider:   DR. Alvis Ba  We recommend signing up for the patient portal called "MyChart".  Sign up information is provided on this After Visit Summary.  MyChart is used to connect with patients for Virtual Visits (Telemedicine).  Patients are able to view lab/test results, encounter notes, upcoming appointments, etc.  Non-urgent messages can be sent to your provider as well.   To learn more about what you can do with MyChart, go to [email or web address].   Other Instructions

## 2024-03-08 NOTE — Telephone Encounter (Signed)
 Hi Rhonda, did not know who else to ask for this.   We need to ask for this lady's insurance to approve a surgical procedure at [facility] to be approved as a in network authorization, since we do not have a mitral valve surgeon available at [facility] currently.  Could you please let me know who to address this request to? Thanks, Dr. JAYSON

## 2024-03-11 NOTE — Telephone Encounter (Signed)
 Not sure if our pre-CERT department knows how to do it.  She requires a minimally invasive mitral valve repair via right thoracotomy, which we are unable to offer at [facility] right now after Dr. Adriana departure

## 2024-04-08 DIAGNOSIS — I340 Nonrheumatic mitral (valve) insufficiency: Secondary | ICD-10-CM | POA: Diagnosis not present

## 2024-04-30 NOTE — H&P (Signed)
 Cardiothoracic Surgery History and Physical   Date of Service: 05/12/24    Time: 6:21 PM  Referring Provider:  Parthenia Olivia HERO Primary Care Physician:  Clarice Ryan BIRCH, MD  Chief Complaint: dyspnea  History of Present Illness: Beth Chapman is a 73 y.o. female never-smoker with PMH of MVP/MR, HTN, HLD who is admitted to [facility] for medical optimization prior to surgery.   Patient has a long-standing history of cardiac murmur and has been followed at [facility] for the past 10 years with serial echos. She presented recently with progressively worsening fatigue and dyspnea on exertion. Repeat echo revealed progression of MR to severe, confirmed on subsequent TEE and cath. LHC revealed no coronary disease.  Given these findings, she was referred to Dr. Ricky who recommended proceeding with surgical intervention.    Past Medical History:  Past Medical History:  Diagnosis Date  . Anxiety state 11/02/2007   Qualifier: Diagnosis of   By: Joshua OBIE CELESTER Tylene      IMO SNOMED Dx Update Oct 2024    . Classic migraine 11/12/2013  . Degeneration of cervical intervertebral disc 11/02/2007   Qualifier: Diagnosis of   By: Joshua RN, CGRN, Sheri    . Exertional dyspnea 03/04/2024  . HTN (hypertension)   . Mitral chordae rupture (CMS/HHS-HCC): Flail P2 03/04/2024  . Mixed hyperlipidemia 12/12/2022  . Nonrheumatic mitral valve regurgitation 12/12/2022   Past Surgical History:  Past Surgical History:  Procedure Laterality Date  . Hx: LAPAROSCOPIC ABDOMINAL EXPLORATION     Family History:   Family History  Problem Relation Name Age of Onset  . Kidney disease Mother    . Alzheimer's disease Father    . Coronary Artery Disease (Blocked arteries around heart) Sister    . Heart disease Brother    . Myocardial Infarction (Heart attack) Brother         Heart attack in her brother  . Kidney failure Brother    . Stroke Brother     Social History:  Social History   Socioeconomic History  .  Marital status: Married  Tobacco Use  . Smoking status: Never  . Smokeless tobacco: Never  Vaping Use  . Vaping status: Never Used  Substance and Sexual Activity  . Alcohol use: Yes    Alcohol/week: 1.0 - 2.0 standard drink of alcohol    Types: 1 - 2 Standard drinks or equivalent per week  . Drug use: Never     No Known Allergies  Medications: Prior to Admission medications  Medication Sig Taking? Last Dose  cyclobenzaprine (FLEXERIL) 10 MG tablet Take 10 mg by mouth as needed  05/10/2024  KRILL OIL ORAL Take by mouth Takes 3-4 times per week  05/06/2024  MAGNESIUM GLYCINATE ORAL Take 2 capsules by mouth at bedtime  05/06/2024  meloxicam (MOBIC) 15 MG tablet Take 15 mg by mouth once daily  05/10/2024  multivitamin tablet Take 1 tablet by mouth once daily  05/06/2024  TURMERIC ROOT-GINGER ROOT EXT ORAL Take by mouth gummy  05/06/2024    Review of Systems:  Constitutional: negative for chills, fevers, or flu like symptoms HEENT:  Negative for dysphagia Pulmonary:  Negative for dyspnea, cough, or dyspnea on exertion Cardiovascular:  Negative for chest pain, palpitations, syncope, PND, lower extremity edema, and orthopnea GI: Negative for nausea, vomiting, abdominal pain, constipation, diarrhea, or blood in stool GU:  Negative for dysuria and hematuria Hem/lymphatic: Negative for history of DVT or PE, or easy bruising Skin: Negative for rash and new  skin lesion(s) Extremities:  Negative for numbness or tingling  Physical Exam: Vitals:   05/12/24 1713  BP: (!) 160/85  Pulse: 72  Resp:   Temp:     General appearance: alert and cooperative, NAD. Neurologic: Alert and oriented X 3, normal strength and tone. HEENT: Head: Normocephalic, atraumatic, without obvious abnormality. Pharynx: dentition normal. Normal pharynx. Neck: no adenopathy, no carotid bruit, no JVD and supple, trachea midline Lungs: clear to auscultation bilaterally Heart: regular rate and rhythm,  S1, S2, no S3  present, no murmur, rub, or gallop. No JVD.  Abdomen: soft, non-tender; bowel sounds normal; no masses,  no organomegaly Extremities: extremities normal, atraumatic, no cyanosis or edema Pulses: 2+ radial, TP/DP equal bilaterally  Skin: no rashes or lesions.   Data: No results found for: WBC, HGB, HCT, PLT No results found for: NA, K, CL, CO2, BUN, CREATININE, GLUCOSE No results found for: CALCIUM, MG, PHOS No results found for: AST, ALT, ALKPHOS, TBILI, CONJBILI, ALB, TOTALPROTEIN No results found for: APTT, INR No results found for: HGBA1C  Assessment / Impression:    Nonrheumatic mitral valve regurgitation   Mixed hyperlipidemia   Exertional dyspnea   Mitral chordae rupture (CMS/HHS-HCC): Flail P2   Plan:  Surgery Planned: mitral valve repair vs replacement with Dr Ricky 9/22  Consent signed Preoperative teaching completed Patient verbalizes understanding of instructions Preoperative orders completed.  CTS Quality Measures:  CAD: Not Indicated. HF: EF >55%: ACE/ARB: Not Indicated. DVT ppx: Not Indicated.   LONNY ONALEE HAMILTON, NP Cardiothoracic Surgery

## 2024-05-12 DIAGNOSIS — R5381 Other malaise: Secondary | ICD-10-CM | POA: Diagnosis not present

## 2024-05-12 DIAGNOSIS — I071 Rheumatic tricuspid insufficiency: Secondary | ICD-10-CM | POA: Diagnosis not present

## 2024-05-12 DIAGNOSIS — K3189 Other diseases of stomach and duodenum: Secondary | ICD-10-CM | POA: Diagnosis not present

## 2024-05-12 DIAGNOSIS — G8918 Other acute postprocedural pain: Secondary | ICD-10-CM | POA: Diagnosis not present

## 2024-05-12 DIAGNOSIS — J90 Pleural effusion, not elsewhere classified: Secondary | ICD-10-CM | POA: Diagnosis not present

## 2024-05-12 DIAGNOSIS — I517 Cardiomegaly: Secondary | ICD-10-CM | POA: Diagnosis not present

## 2024-05-12 DIAGNOSIS — J948 Other specified pleural conditions: Secondary | ICD-10-CM | POA: Diagnosis not present

## 2024-05-12 DIAGNOSIS — R531 Weakness: Secondary | ICD-10-CM | POA: Diagnosis not present

## 2024-05-12 DIAGNOSIS — Z452 Encounter for adjustment and management of vascular access device: Secondary | ICD-10-CM | POA: Diagnosis not present

## 2024-05-12 DIAGNOSIS — I371 Nonrheumatic pulmonary valve insufficiency: Secondary | ICD-10-CM | POA: Diagnosis not present

## 2024-05-12 DIAGNOSIS — J9811 Atelectasis: Secondary | ICD-10-CM | POA: Diagnosis not present

## 2024-05-12 DIAGNOSIS — I511 Rupture of chordae tendineae, not elsewhere classified: Secondary | ICD-10-CM | POA: Diagnosis not present

## 2024-05-12 DIAGNOSIS — Z4659 Encounter for fitting and adjustment of other gastrointestinal appliance and device: Secondary | ICD-10-CM | POA: Diagnosis not present

## 2024-05-12 DIAGNOSIS — R918 Other nonspecific abnormal finding of lung field: Secondary | ICD-10-CM | POA: Diagnosis not present

## 2024-05-12 DIAGNOSIS — Z79899 Other long term (current) drug therapy: Secondary | ICD-10-CM | POA: Diagnosis not present

## 2024-05-12 DIAGNOSIS — Z4682 Encounter for fitting and adjustment of non-vascular catheter: Secondary | ICD-10-CM | POA: Diagnosis not present

## 2024-05-12 DIAGNOSIS — I340 Nonrheumatic mitral (valve) insufficiency: Secondary | ICD-10-CM | POA: Diagnosis not present

## 2024-05-12 DIAGNOSIS — I10 Essential (primary) hypertension: Secondary | ICD-10-CM | POA: Diagnosis not present

## 2024-05-12 DIAGNOSIS — J939 Pneumothorax, unspecified: Secondary | ICD-10-CM | POA: Diagnosis not present

## 2024-05-12 DIAGNOSIS — E782 Mixed hyperlipidemia: Secondary | ICD-10-CM | POA: Diagnosis not present

## 2024-05-13 NOTE — Progress Notes (Signed)
 Anesthesia Transfer of Care Note  Patient: Beth Chapman  Procedures performed: ADULT, VALVULOPLASTY, MITRAL VALVE, VIA HEARTPORT, WITH CARDIOPULMONARY BYPASS; RADICALRECONSTRUCTION, WITH OR WITHOUT RING (Chest) ADULT, REPLACEMENT, MITRAL VALVE, VIA HEARTPORT, WITH CARDIOPULMONARY BYPASS, possible (Chest)  Report given/handover to: ICU Nurse

## 2024-05-15 NOTE — Progress Notes (Signed)
 Physical Therapy Progress Note  Patient Name:  Beth Chapman Date of Therapy Session: 05/15/24 Time of Therapy Session:  1456 Duration of Session:  38 Minutes Room/Bed: 6114/6114-01  Precautions: Falls Risk, Pacemaker   Assessment: Chart reviewed/RN denies acute issues Pt met supported by 0.5L [place] w/family present. Agreeable to PT Tx session, room prepped for mobility.   Pt continues to demo slow progress towards goals w/ rehab visit focus on OOB transfers, upright transfers, and ambulatory activity tolerance w/ household distances using a potential home mobility device.  No LOB noted t/o session.   Of note, pt able to wean to RA at rest but required 4L O2 [place] w/ activity w/ otherwise VSS.  RN aware.   Pt/family educated/acknowledged merits of maximizing time OOB w/ minimum 3x/daily ambulation to support pt's overall recovery.  Anticipate pt will continue to progress w/more time for recovery/medical optimization.     PT to follow to continue promoting max recovery of functional mobility in support of pt's preferred DC plan per POC   Today's Treatment:: Pt tolerated tx well today, limited by fatigue/>WOB.  Pt performed the following functional mobility during this session: Minimal assist (pt. performs > 75%) of 1 for supine to sit, Contact Guard Assist (Hand Touch) of 1 for STS transfers using Rolling walker, Contact Guard Assist (Hand Touch)  for 300 feet using Rolling walker.  See flows below for additional details on rehab visit.   The patient will continue to benefit from the skills of a physical therapist to address Decreased bed mobility, Decreased transfers, Gait/Ambulation limitations, Decreased endurance/activity tolerance, Impaired functional mobility, Decreased balance.  Limiting factors to progression may include medical complexity.    Recommendations for mobility with nursing:  Use BMAT score and associated clinical judgement to determine safe mobility on a daily  basis as patient status may be subject to change.  Nursing to assist patient as tolerated assist x 1 w/ transfers to EOB, transfer to recliner, ambulation in room, and ambulation via rolling walker, hand held assist, and chair follow supported by 3L O2 [place] (Wean/titrate as appropriate/necessary) , monitor for Vitals and O2 saturation  Strategies for increasing daily activity engagement: Encourage sit up in recliner or bed in chair position 2-3x/day(ad lib & especially w/meals) Turn lights on/open window shades during day Prioritize non-pharmacologic strategies to minimize delirium from a safety standpoint When cleared safe for ambulation in hallway, promote minimum 3x/day ambulation to prevent hospital acquired deconditioning.   Discharge Recommendations: Is the patient safe to discharge to the recommended disposition? No Discharge Recommendations: Home, Outpatient Cardiac Rehab DME Recommendations    Flowsheet Row Most Recent Value  DME Recommendations To be determined * [likely rollator] Filed at 05/15/2024 1456         Complete details of today's session: At end of session, the patient was left with all needs in reach, with nurse call device in reach, seated in recliner at bedside, with family present. Her status was communicated to the RN, Patient, Family.     05/15/24 1456  Discipline Timestamp  Discipline Timestamp PT  Documentation Type     Documentation Type                                E,R, T   Treatment  Patient Subjective Information  Patient Subjective Information Patient agreeable to therapy  Precautions  Precautions Falls Risk;Pacemaker  Vital Signs  Heart Rate 91 (101 max w/ activity)  Heart Rate Source Monitor  BP 129/72  MAP (mmHg) 88  BP Location Right upper arm  BP Method Automatic  Patient Position Lying  Pain Assessment  Pain Assessment %% 0-10  Pain Score %%  (did not rate)  Pain Type Surgical pain  Pain Loc BACK  Pain Orientation Right  Review of  Systems Cardiovascular/Pulmonary Function  SpO2 96 % (able to wean to RA at rest but up 3-4L O2 w/ activity)  Any supplemental oxygen? Yes  O2 Flow Rate (L/min) 0.5 L/min  SpO2 Probe Type/Location Finger clip probe  Review of Systems - Programmer, systems Judgment Patient at risk for falls  [place] Agitation Sedation Scale %% 0 Alert and calm  Mobility  Bed Mobility  Supine to Sit;Sit to Supine;Sit to Stand;Stand to Sit       Supine to Sit Assistance Minimal assist (pt. performs > 75%)       Supine to Sit Details Requires extra time;Set up;Verbal cues;Management of lines and tubes       Number of People Required 1       Sit to Stand Assistance Contact Guard Assist (Hand Touch)       Sit to Stand Details Requires extra time;Set up;Verbal cues;Management of lines and tubes       Number of People Required 1       Sit to Stand Assistive Devices Walker       Walker type Rolling       Stand to [employer] Assist (Photographer)       Stand to Sit Details Requires extra time;Set up;Verbal cues;Management of lines and tubes       Number of People Required 1       Stand to Sit Assistive Devices Walker       Walker type Runner, broadcasting/film/video Yes       Ambulation Teacher, English as a foreign language (Photographer)       Number of People Required 1  Distance ambulated in feet 300 feet  Ambulation Assistive Device Walker       Walker type Rolling      Other Apparatus  Wheelchair follow   Number of rest breaks  (multiple short in standing for breath pacing)  Gait Pattern Shuffle;Narrow base of support;Decreased push off  Gait Pattern Swing Phase Decreased step length, bilateral  Adult PT Outcomes  Highest Level of Function Yes  Highest Level of Activity 9- Walking A x 1  Adverse Events 0- None  Inpatient AM-PAC Performed Basic Mobility Inpatient Short Form - 6 Clicks  AM-PAC 6 Clicks Basic Mobility Inpatient Short Form  Turning from your back to your side while  in a flat bed without using bedrails? 3-A Little  Moving from lying on your back to sitting on the side of a flat bed without using bedrails? 3-A Little  Moving to and from a bed to a chair (including a wheelchair)? 3-A Little  Standing up from a chair using using your arms (e.g,. wheelchair, or bedside chair)? 3-A Little  To walk in hospital room? 3-A Little  Climbing 3-5 steps with a railing? 3-A Little  AM-PAC Basic Mobility Raw Score 18  AM-PAC Basic Mobility t-Scale Score 41.05  AM-PAC Basic Mobility G-Code Modifier CK  Patient Status At End of Session  Status Communicated to: RN;Patient;Family  Pt Left: with all  needs in reach;with nurse call device in reach;seated in recliner at bedside;with family present  Assessment  PT Barriers Low activity tolerance;Decreased activity tolerance/medically complex/comorbidities;Supplemental oxygen needs  Impairments/Functional Limitations    Decreased bed mobility;Decreased transfers;Gait/Ambulation limitations;Decreased endurance/activity tolerance;Impaired functional mobility;Decreased balance  Rehab Potential   Good  Patient safe for DC/PT perspective? No  Summary of Findings  Tolerated treatment well;The patient demonstrates decreased function secondary to;Patient is progressing slowly toward written goals secondary to  Patient is Progressing Slowly Toward Written Goals Secondary to Medical status limitations;Decreased activity tolerance  Patient Demonstrates Decreased Function Secondary to Medical status limitations;Decreased activity tolerance  Plan  Treatment/Interventions Bed mobility;Transfers;Patient and family education;Gait training;Mobility training;Endurance training  PT Frequency 4x per week  Discharge Recommendation (DUH/DRH) Home;Outpatient Cardiac Rehab  DME Recommendations To be determined (likely rollator)  Plan(Progress Note) Continue current plan of care      Please see patient education record for PT teaching completed  today.   TOMAS MUNILLA, PT     *Some images could not be shown.

## 2024-05-17 DIAGNOSIS — R531 Weakness: Secondary | ICD-10-CM | POA: Diagnosis not present

## 2024-05-17 NOTE — Progress Notes (Signed)
 Physical Therapy Progress Note  Patient Name:  Beth Chapman Date of Therapy Session: 05/17/24 Time of Therapy Session:  1330 Duration of Session:  25 Minutes Room/Bed: 6114/6114-01  Precautions: Falls Risk   Assessment: Patient tolerated therapy session well. Reported ambulating two laps with RN earlier this morning. Ambulated an additional 2 laps with PT at a slow but continuous pace. Denies needing to navigate stairs post discharge. PT provided education on appropriate rollator use as she initially had difficulty with brake management. Overall she is safe to return home. Recommend a referral to outpatient cardiac rehab, as this will guide pt in progressing their exercise tolerance/intensity in a controlled environment. This will also assist pt in improving their self-efficacy and returning to their PLOF. PT will sign off at this time due to no further acute PT needs. Please re-consult should changes in functional status arise while in house.      Recommendations for mobility with nursing:  Use BMAT score and associated clinical judgement to determine safe mobility on a daily basis as patient status may be subject to change.  Patient is safe to ambulate with RN assistance x1 using a rollator. Encourage to ambulate 3x daily   Discharge Recommendations: Is the patient safe to discharge to the recommended disposition? Yes Discharge Recommendations: Home, Outpatient Cardiac Rehab DME Recommendations    Flowsheet Row Most Recent Value  DME Recommendations Mobility Equipment Filed at 05/17/2024 1330  Mobility aid                                         Walker Filed at 05/17/2024 1330       Walker type Rollator Filed at 05/17/2024 1330   Patient would benefit from a rollator walker at home and has a mobility limitation that significantly impairs his/her ability to participate in one or more mobility-related activities of daily living (MRADLs), the patient is able to safely use the  walker, and the functional mobility deficit can be sufficiently resolved with the use of a walker.  Complete details of today's session: Chart reviewed. RN cleared patient for therapy. Details of today's session can be found below in the flowsheet. In summary, the patient was found semi-reclined in bed and agreeable to therapy. At end of session, the patient was left with all needs in reach, with nurse call device in reach, semi-reclined in bed. Her status was communicated to the RN, Patient.     05/17/24 1330  Discipline Timestamp  Discipline Timestamp PT  Documentation Type     Documentation Type                                E,R, T   Treatment  Patient Subjective Information  Patient Subjective Information Patient agreeable to therapy  Precautions  Precautions Falls Risk  Patient/Family Goals  Patient/Family Goals Go home;Take care of myself  Vital Signs  Heart Rate 92  Heart Rate Source Monitor  Pain Assessment  Pain Assessment %% 0-10  Pain Score %% Zero  Review of Systems Cardiovascular/Pulmonary Function  Any supplemental oxygen? No  Review of Systems - Cognition                         Overall Cognitive Status WNL  Behavior Cooperative  Safety Judgment Patient at risk for falls  Communication  WNL  Mobility       Supine to Sit Assistance Supervision       Supine to Sit Details Head of bed elevated       Number of People Required 1       Sit to Stand Assistance Supervision       Sit to Stand Details Requires extra time       Number of People Required 1       Sit to Stand Assistive Devices Walker       Walker type Rollator       Stand to [employer] Supervision       Stand to Sit Details Requires extra time;Set up;Verbal cues;Management of lines and tubes       Number of People Required 1       Stand to Sit Assistive Devices Walker       Walker type Rollator  Ambulation Yes       Ambulation Assistance Supervision       Number of People Required 1  Distance  ambulated in feet 600 feet  Ambulation Assistive Device Walker       Walker type Rollator  Stairs/Curb assessed No  Adult PT Outcomes  Highest Level of Function Yes  Highest Level of Activity 9- Walking A x 1  Time in Highest Level of Activity 6-10 min  Adverse Events 0- None  Inpatient AM-PAC Performed Basic Mobility Inpatient Short Form - 6 Clicks  AM-PAC 6 Clicks Basic Mobility Inpatient Short Form  Turning from your back to your side while in a flat bed without using bedrails? 4-None  Moving from lying on your back to sitting on the side of a flat bed without using bedrails? 4-None  Moving to and from a bed to a chair (including a wheelchair)? 4-None  Standing up from a chair using using your arms (e.g,. wheelchair, or bedside chair)? 4-None  To walk in hospital room? 4-None  Climbing 3-5 steps with a railing? 3-A Little  AM-PAC Basic Mobility Raw Score 23  AM-PAC Basic Mobility t-Scale Score 50.88  AM-PAC Basic Mobility G-Code Modifier CI  Patient Status At End of Session  Status Communicated to: RN;Patient  Pt Left: with all needs in reach;with nurse call device in reach;semi-reclined in bed  Assessment  PT Enhancers Good family support/resources;Motivated;Accessible home;Intact cognition;Previous level of function/active lifestyle;Good Upper Extremity Strength  PT Barriers Low activity tolerance;Decreased activity tolerance/medically complex/comorbidities;Supplemental oxygen needs  Impairments/Functional Limitations    Decreased bed mobility;Decreased transfers;Gait/Ambulation limitations;Decreased endurance/activity tolerance;Impaired functional mobility;Decreased balance  Rehab Potential   Good  Patient safe for DC/PT perspective? Yes  Summary of Findings  Tolerated treatment well  Patient Demonstrates Decreased Function Secondary to Medical status limitations;Decreased activity tolerance  Plan  Treatment/Interventions Bed mobility;Transfers;Patient and family  education;Gait training;Mobility training;Endurance training  PT Frequency Patient discharged from PT  Discharge Recommendation (DUH/DRH) Home;Outpatient Cardiac Rehab  DME Recommendations Mobility Equipment  Mobility aid                                         Walker       Walker type Rollator  Plan(Progress Note) Discontinue physical therapy      Please see patient education record for PT teaching completed today.   SHAN MOOSE, PT

## 2024-05-18 NOTE — Progress Notes (Signed)
 Pt met criteria for discharge and discharge order entered by provider. VSS. Discharge instructions reviewed with pt in detail. They reported no questions or concerns. Discharge paperwork signed. 1 PIV removed and pt disconnected from tele. Pt left 6100 via wheelchair accompanied by transport and friend . All belongings sent with pt on cart, husband took Rollator.

## 2024-05-18 NOTE — Care Plan (Signed)
  Problem: Hemodynamic Instability: Goal: Patient will maintain optimal hemodynamic status. Outcome: Progressing Goal: Patient will maintain hemostasis. Outcome: Progressing   Problem: Cognitive: Goal: Patient will maintain baseline neurological and functional status. Outcome: Progressing Goal: Mental status will improve. Outcome: Progressing   Problem: Respiratory: Goal: Patient will maintain adequate gas exchange. Outcome: Progressing   Problem: Safety Goal: Free from accidental physical injury Outcome: Progressing Goal: Free from abuse Outcome: Progressing   Problem: Daily Care Goal: Daily care needs are met Description: Assess and monitor ability to perform self care and identify potential discharge needs. Outcome: Progressing   Problem: Pain Goal: Patient's pain/discomfort is manageable Description: Assess and monitor patient's pain using appropriate pain scale. Collaborate with interdisciplinary team and initiate plan and interventions as ordered. Re-assess patient's pain level 30 - 60 minutes after pain management intervention.  Outcome: Progressing   Problem: Discharge Barriers Goal: Patient's discharge needs are met Description: Collaborate with interdisciplinary team and initiate plans and interventions as needed.  Outcome: Progressing   Problem: All patients at High risk for falls Goal: Patient will remain free of falls. Outcome: Progressing   Problem: Gait Alteration Goal: Patient will mobilize safely without falling Outcome: Progressing   Problem: Cognitive/Mental or Behavior Alteration Goal: Patient will mobilize safely without falling Outcome: Progressing   Problem: Medications Goal: Patient will mobilize safely without falling Outcome: Progressing

## 2024-05-18 NOTE — Care Plan (Signed)
  Problem: Hemodynamic Instability: Goal: Patient will maintain optimal hemodynamic status. Outcome: Met/ Completed Goal: Patient will maintain hemostasis. Outcome: Met/ Completed   Problem: Cognitive: Goal: Patient will maintain baseline neurological and functional status. Outcome: Met/ Completed Goal: Mental status will improve. Outcome: Met/ Completed   Problem: Respiratory: Goal: Patient will maintain adequate gas exchange. Outcome: Met/ Completed   Problem: Safety Goal: Free from accidental physical injury Outcome: Met/ Completed Goal: Free from abuse Outcome: Met/ Completed   Problem: Daily Care Goal: Daily care needs are met Description: Assess and monitor ability to perform self care and identify potential discharge needs. Outcome: Met/ Completed   Problem: Pain Goal: Patient's pain/discomfort is manageable Description: Assess and monitor patient's pain using appropriate pain scale. Collaborate with interdisciplinary team and initiate plan and interventions as ordered. Re-assess patient's pain level 30 - 60 minutes after pain management intervention.  Outcome: Met/ Completed   Problem: Discharge Barriers Goal: Patient's discharge needs are met Description: Collaborate with interdisciplinary team and initiate plans and interventions as needed.  Outcome: Met/ Completed   Problem: All patients at High risk for falls Goal: Patient will remain free of falls. Outcome: Met/ Completed   Problem: Gait Alteration Goal: Patient will mobilize safely without falling Outcome: Met/ Completed   Problem: Cognitive/Mental or Behavior Alteration Goal: Patient will mobilize safely without falling Outcome: Met/ Completed   Problem: Medications Goal: Patient will mobilize safely without falling Outcome: Met/ Completed

## 2024-05-24 ENCOUNTER — Telehealth (HOSPITAL_COMMUNITY): Payer: Self-pay

## 2024-05-24 NOTE — Telephone Encounter (Signed)
 Outside/paper referral received from [facility]. Will fax over request for MD order and EKG if patient confirms interest. Insurance benefits and eligibility to be determined.   Attempted to call patient regarding cardiac rehab- no answer, left message. Sent MyChart message.

## 2024-05-29 ENCOUNTER — Telehealth (HOSPITAL_COMMUNITY): Payer: Self-pay

## 2024-05-29 NOTE — Telephone Encounter (Signed)
 Outside/paper referral received by Dr. Gaca from [place]. Will fax over request for EKG. Insurance benefits and eligibility to be determined.   Patient confirmed interest, has f/u on 10/16 with cardiac surgeon, does not have f/u with cardiologist set up yet. Informed patient she must set up that appt and attend to get clearance for cardiac rehab, she stated she will work on getting that set up as soon as possible.

## 2024-06-01 ENCOUNTER — Encounter (HOSPITAL_COMMUNITY): Payer: Self-pay

## 2024-06-01 ENCOUNTER — Emergency Department (HOSPITAL_COMMUNITY)
Admission: EM | Admit: 2024-06-01 | Discharge: 2024-06-01 | Disposition: A | Discharge status: Home / Self Care | Attending: Emergency Medicine | Admitting: Emergency Medicine

## 2024-06-01 ENCOUNTER — Emergency Department (HOSPITAL_COMMUNITY)

## 2024-06-01 ENCOUNTER — Other Ambulatory Visit: Payer: Self-pay

## 2024-06-01 DIAGNOSIS — I4891 Unspecified atrial fibrillation: Secondary | ICD-10-CM | POA: Insufficient documentation

## 2024-06-01 DIAGNOSIS — R0602 Shortness of breath: Secondary | ICD-10-CM | POA: Diagnosis not present

## 2024-06-01 DIAGNOSIS — I499 Cardiac arrhythmia, unspecified: Secondary | ICD-10-CM | POA: Diagnosis not present

## 2024-06-01 DIAGNOSIS — R000 Tachycardia, unspecified: Secondary | ICD-10-CM | POA: Diagnosis not present

## 2024-06-01 DIAGNOSIS — R002 Palpitations: Secondary | ICD-10-CM | POA: Diagnosis not present

## 2024-06-01 LAB — PROTIME-INR
INR: 0.9 (ref 0.8–1.2)
Prothrombin Time: 12.6 s (ref 11.4–15.2)

## 2024-06-01 LAB — COMPREHENSIVE METABOLIC PANEL WITH GFR
ALT: 100 U/L — ABNORMAL HIGH (ref 0–44)
AST: 121 U/L — ABNORMAL HIGH (ref 15–41)
Albumin: 3.1 g/dL — ABNORMAL LOW (ref 3.5–5.0)
Alkaline Phosphatase: 95 U/L (ref 38–126)
Anion gap: 14 (ref 5–15)
BUN: 23 mg/dL (ref 8–23)
CO2: 20 mmol/L — ABNORMAL LOW (ref 22–32)
Calcium: 8.9 mg/dL (ref 8.9–10.3)
Chloride: 105 mmol/L (ref 98–111)
Creatinine, Ser: 0.89 mg/dL (ref 0.44–1.00)
GFR, Estimated: >60 mL/min (ref >60)
Glucose, Bld: 111 mg/dL — ABNORMAL HIGH (ref 70–99)
Potassium: 4 mmol/L (ref 3.5–5.1)
Sodium: 139 mmol/L (ref 135–145)
Total Bilirubin: 0.2 mg/dL (ref 0.0–1.2)
Total Protein: 5.7 g/dL — ABNORMAL LOW (ref 6.5–8.1)

## 2024-06-01 LAB — MAGNESIUM: Magnesium: 2 mg/dL (ref 1.7–2.4)

## 2024-06-01 LAB — CBC WITH DIFFERENTIAL/PLATELET
Abs Immature Granulocytes: 0.03 K/uL (ref 0.00–0.07)
Basophils Absolute: 0.1 K/uL (ref 0.0–0.1)
Basophils Relative: 1 %
Eosinophils Absolute: 0.3 K/uL (ref 0.0–0.5)
Eosinophils Relative: 3 %
HCT: 37.5 % (ref 36.0–46.0)
Hemoglobin: 11.7 g/dL — ABNORMAL LOW (ref 12.0–15.0)
Immature Granulocytes: 0 %
Lymphocytes Relative: 23 %
Lymphs Abs: 1.8 K/uL (ref 0.7–4.0)
MCH: 29.5 pg (ref 26.0–34.0)
MCHC: 31.2 g/dL (ref 30.0–36.0)
MCV: 94.7 fL (ref 80.0–100.0)
Monocytes Absolute: 0.6 K/uL (ref 0.1–1.0)
Monocytes Relative: 7 %
Neutro Abs: 5.2 K/uL (ref 1.7–7.7)
Neutrophils Relative %: 66 %
Platelets: 368 K/uL (ref 150–400)
RBC: 3.96 MIL/uL (ref 3.87–5.11)
RDW: 13.2 % (ref 11.5–15.5)
WBC: 7.9 K/uL (ref 4.0–10.5)
nRBC: 0 % (ref 0.0–0.2)

## 2024-06-01 LAB — TSH: TSH: 1.437 u[IU]/mL (ref 0.350–4.500)

## 2024-06-01 LAB — LIPASE, BLOOD: Lipase: 39 U/L (ref 11–51)

## 2024-06-01 NOTE — Discharge Instructions (Signed)
 You are seen in the emerged from today with atrial fibrillation.  You converted back to normal sinus rhythm.  Please discuss with your surgeon at next week's appointment if you would be able to start anticoagulation, or blood thinner such as Eliquis.  If they are okay with you starting this the cardiology team or your primary care doctor can order this medication to begin at home.  Having A-fib, even intermittently, it can increase your chance of stroke.  If you develop return of palpitations, worsening chest pain, weakness/numbness he should return to the emergency department for reevaluation.

## 2024-06-01 NOTE — ED Provider Notes (Signed)
 Emergency Department Provider Note   I have reviewed the triage vital signs and the nursing notes.   HISTORY  Chief Complaint Irregular Heart Beat   HPI Beth Chapman is a 73 y.o. female with PMH of HLD, mitral regurgitation s/p MV repair at [facility] ok 09/22 presents to the ED with acute onset palpitations and SOB. She called EMS and was found to be in A fib with RVR. She was given 10 mg of Diltiazem and converted to NSR prior to ED arrival. No symptoms currently. No known history of a fib. After discharge from [facility] she was having fatigue and her Metoprolol 25 mg dose was split into BID dosing along with her lasix dosing. Fatigue improved afterwards. No active CP or SOB symptoms.   Past Medical History:  Diagnosis Date   Allergy    Anxiety    Arthritis    Classic migraine 11/12/2013   Heart murmur    Migraines     Review of Systems  Constitutional: No fever/chills Cardiovascular: Denies chest pain. Positive palpitations.  Respiratory: Positive shortness of breath. Gastrointestinal: No abdominal pain.  No nausea, no vomiting.  Skin: Negative for rash. Neurological: Negative for headaches.  ____________________________________________   PHYSICAL EXAM:  VITAL SIGNS: ED Triage Vitals  Encounter Vitals Group     BP 06/01/24 0058 109/81     Pulse Rate 06/01/24 0058 98     Resp 06/01/24 0058 15     Temp 06/01/24 0058 98.3 F (36.8 C)     Temp Source 06/01/24 0058 Oral     SpO2 06/01/24 0058 98 %     Weight 06/01/24 0102 128 lb (58.1 kg)     Height 06/01/24 0102 5' 1 (1.549 m)   Constitutional: Alert and oriented. Well appearing and in no acute distress. Eyes: Conjunctivae are normal.  Head: Atraumatic. Nose: No congestion/rhinnorhea. Mouth/Throat: Mucous membranes are moist.  Neck: No stridor.  Cardiovascular: Normal rate, regular rhythm. Good peripheral circulation. Grossly normal heart sounds.   Respiratory: Normal respiratory effort.  No retractions. Lungs  CTAB. Gastrointestinal: Soft and nontender. No distention.  Musculoskeletal: No lower extremity tenderness nor edema. No gross deformities of extremities. Neurologic:  Normal speech and language. No gross focal neurologic deficits are appreciated.  Skin:  Skin is warm, dry and intact. No rash noted. Incision to the right lateral chest is well appearing.    ____________________________________________   LABS (all labs ordered are listed, but only abnormal results are displayed)  Labs Reviewed  COMPREHENSIVE METABOLIC PANEL WITH GFR - Abnormal; Notable for the following components:      Result Value   CO2 20 (*)    Glucose, Bld 111 (*)    Total Protein 5.7 (*)    Albumin 3.1 (*)    AST 121 (*)    ALT 100 (*)    All other components within normal limits  CBC WITH DIFFERENTIAL/PLATELET - Abnormal; Notable for the following components:   Hemoglobin 11.7 (*)    All other components within normal limits  LIPASE, BLOOD  PROTIME-INR  TSH  MAGNESIUM   ____________________________________________  EKG   EKG Interpretation Date/Time:  Saturday June 01 2024 01:07:45 EDT Ventricular Rate:  96 PR Interval:  151 QRS Duration:  105 QT Interval:  357 QTC Calculation: 452 R Axis:   143  Text Interpretation: Sinus rhythm Ventricular premature complex Probable left atrial enlargement Consider right ventricular hypertrophy Borderline T abnormalities, inferior leads Confirmed by Darra Chew [phone]) on 06/01/2024 1:17:16 AM  ____________________________________________  RADIOLOGY  DG Chest 2 View Result Date: 06/01/2024 CLINICAL DATA:  Cardiac palpitations and shortness of breath EXAM: CHEST - 2 VIEW COMPARISON:  04/10/2018 FINDINGS: Cardiac shadow is within normal limits. Postsurgical changes are noted. Right basilar atelectasis is noted. No sizable effusion is seen. No bony abnormality is noted. IMPRESSION: Right basilar atelectasis. Electronically Signed   By: Oneil Devonshire  M.D.   On: 06/01/2024 01:42    ____________________________________________   PROCEDURES  Procedure(s) performed:   Procedures  None ____________________________________________   INITIAL IMPRESSION / ASSESSMENT AND PLAN / ED COURSE  Pertinent labs & imaging results that were available during my care of the patient were reviewed by me and considered in my medical decision making (see chart for details).   This patient is Presenting for Evaluation of palpitations, which does require a range of treatment options, and is a complaint that involves a high risk of morbidity and mortality.  The Differential Diagnoses include A fib RVR, SVT, AVNRT, NSVT, etc.  I did obtain Additional Historical Information from EMS.  I decided to review pertinent External Data, and in summary Mitral valve repaired on 9/22 at [facility]. She is not currently anticoagulated.    Clinical Laboratory Tests Ordered, included TSH normal.  Normal potassium and magnesium.  INR normal.  Mild anemia.  Radiologic Tests Ordered, included CXR. I independently interpreted the images and agree with radiology interpretation.   Cardiac Monitor Tracing which shows NSR with occasional PVC.    Social Determinants of Health Risk patient is a non-smoker.    Medical Decision Making: Summary:  The patient was to the emergency department with A-fib RVR who converted with EMS after 10 mg of Cardizem.  Plan for screening blood work to assess electrolytes and thyroid  levels.  Patient recently had mitral valve repair at [facility].   Reevaluation with update and discussion with patient.  We discussed reassuring blood work.  She remains in normal sinus rhythm throughout ED observation.  No lingering symptoms.  No chest pain.  Considered anticoagulation but given her recent surgery plan to defer this for now.  She sees her surgeon next week and can be cleared for anticoagulation at that time.  I have referred her to the A-fib clinic as well  for follow-up.  Considered admission but remains in NSR. Stable for outpatient follow up.   Patient's presentation is most consistent with acute presentation with potential threat to life or bodily function.   Disposition: discharge  ____________________________________________  FINAL CLINICAL IMPRESSION(S) / ED DIAGNOSES  Final diagnoses:  Atrial fibrillation with RVR (HCC)    Note:  This document was prepared using Dragon voice recognition software and may include unintentional dictation errors.  Fonda Law, MD, [facility] Emergency Medicine    Estanislado Surgeon, Fonda MATSU, MD 06/01/24 [phone]

## 2024-06-01 NOTE — ED Triage Notes (Signed)
 Sudden onset of palpitations and sob ems assessed pt in Afib RVR in the 180s. Pt was given Cardizem 10mg , pt rhythm slowed to SR. Pt was getting ready for bed and suddenly felt the rhythm pick up. Pt has valve repair about 2 wks ago at [facility]. Pt alert and oriented.

## 2024-06-06 DIAGNOSIS — Z48812 Encounter for surgical aftercare following surgery on the circulatory system: Secondary | ICD-10-CM | POA: Diagnosis not present

## 2024-06-06 DIAGNOSIS — I517 Cardiomegaly: Secondary | ICD-10-CM | POA: Diagnosis not present

## 2024-06-06 DIAGNOSIS — Z9889 Other specified postprocedural states: Secondary | ICD-10-CM | POA: Diagnosis not present

## 2024-06-06 DIAGNOSIS — J9811 Atelectasis: Secondary | ICD-10-CM | POA: Diagnosis not present

## 2024-06-06 DIAGNOSIS — I7789 Other specified disorders of arteries and arterioles: Secondary | ICD-10-CM | POA: Diagnosis not present

## 2024-06-11 ENCOUNTER — Ambulatory Visit: Admitting: Emergency Medicine

## 2024-06-11 ENCOUNTER — Ambulatory Visit
Admission: RE | Admit: 2024-06-11 | Discharge: 2024-06-11 | Disposition: A | Discharge status: Home / Self Care | Source: Ambulatory Visit | Attending: Internal Medicine | Admitting: Internal Medicine

## 2024-06-11 ENCOUNTER — Encounter: Payer: Self-pay | Admitting: Emergency Medicine

## 2024-06-11 ENCOUNTER — Encounter (HOSPITAL_COMMUNITY): Payer: Self-pay | Admitting: Internal Medicine

## 2024-06-11 ENCOUNTER — Ambulatory Visit (HOSPITAL_COMMUNITY)
Admission: RE | Admit: 2024-06-11 | Discharge: 2024-06-11 | Disposition: A | Discharge status: Home / Self Care | Source: Ambulatory Visit | Attending: Internal Medicine | Admitting: Internal Medicine

## 2024-06-11 VITALS — BP 114/76 | HR 76 | Ht 61.5 in | Wt 130.2 lb

## 2024-06-11 VITALS — BP 102/58 | HR 89 | Ht 64.5 in | Wt 130.0 lb

## 2024-06-11 DIAGNOSIS — I480 Paroxysmal atrial fibrillation: Secondary | ICD-10-CM

## 2024-06-11 DIAGNOSIS — E7800 Pure hypercholesterolemia, unspecified: Secondary | ICD-10-CM | POA: Diagnosis not present

## 2024-06-11 DIAGNOSIS — I340 Nonrheumatic mitral (valve) insufficiency: Secondary | ICD-10-CM

## 2024-06-11 DIAGNOSIS — I4891 Unspecified atrial fibrillation: Secondary | ICD-10-CM | POA: Diagnosis not present

## 2024-06-11 DIAGNOSIS — Z9889 Other specified postprocedural states: Secondary | ICD-10-CM

## 2024-06-11 NOTE — Patient Instructions (Signed)
 Wear monitor for 14 days remove 06/25/24 place in box and send in mail we will call you with report and follow up plan

## 2024-06-11 NOTE — Progress Notes (Signed)
 Cardiology Office Note:    Date:  06/11/2024  ID:  Beth Chapman, DOB 1951-05-18, MRN [id number] PCP: Clarice Nottingham, MD  [facility] Providers Cardiologist:  Jerel Balding, MD       Patient Profile:       Chief Complaint: Follow-up mitral valve repair History of Present Illness:  Beth Chapman is a 73 y.o. female with visit-pertinent history of hypercholesteremia, migraines, arthritis, mitral valve prolapse, mitral regurgitation  Echocardiogram 11/2023 showed LVEF 55 to 60%, RV function normal, the mitral valve is myxomatous, torrential mitral valve regurgitation, mild late systolic prolapse of both leaflets of the mitral valve.  TEE 01/02/2024 showed LVEF 55 to 60%, no RWMA, RV function and size normal, normal PASP, left atrial size mildly dilated, ruptured chordae seen to the middle P2 scallop of the posterior mitral leaflet, with flail motion, severe mitral valve regurgitation, severe prolapse of the middle scallop of the posterior leaflet of the mitral valve.  Cardiac catheterization 01/04/2024 with normal coronary anatomy, normal LV filling pressures, normal right heart pressures and normal cardiac output.  She was last seen in clinic on 01/22/2024.  Patient reported some dyspnea on exertion.  She was referred to [facility] with Dr. Gaca for mitral valve replacement.    She ultimately underwent MV repair (36mm Simulus semi rigid band) via HP on 05/13/2024 .  Her hospital course was unremarkable.  She was discharged home on 05/18/2024.  She was seen on 06/01/2024 in [facility] emergency department with acute onset of palpitations and shortness of breath.  She called EMS and was found to be in A-fib with RVR.  She was given 10 mg of diltiazem and converted to normal sinus rhythm prior to ED arrival.  She remained in normal sinus rhythm throughout ED observation.  She was not started on anticoagulation was to follow-up with cardiology.  She followed up with [facility] cardiothoracic surgery  on 06/06/2024.  Her Lasix and potassium was stopped.  She was to follow-up with cardiothoracic surgery only as needed.   Discussed the use of AI scribe software for clinical note transcription with the patient, who gave verbal consent to proceed.  Today patient is doing well without acute cardiovascular concerns.  She is without any chest pains or dyspnea.  She is currently tolerating her medications well.  Reports her incision sites are healing appropriately.  She does have tenderness to insertion site.  She denies any symptoms concerning for recurrent atrial fibrillation.  She is without any palpitations, dizziness, lightheadedness, or syncope.  She is currently monitoring her heart rhythm with a Fitbit watch.  She is eager to resume physical activity and is interested in cardiac rehab.   Review of systems:  Please see the history of present illness. All other systems are reviewed and otherwise negative.      Studies Reviewed:    EKG Interpretation Date/Time:  Tuesday June 11 2024 13:48:50 EDT Ventricular Rate:  89 PR Interval:  138 QRS Duration:  92 QT Interval:  354 QTC Calculation: 430 R Axis:   62  Text Interpretation: Sinus rhythm with occasional Premature ventricular complexes Low voltage QRS Incomplete right bundle branch block Nonspecific T wave abnormality When compared with ECG of 01-Jun-2024 01:07, PREVIOUS ECG IS PRESENT Confirmed by Terra Pac ([id number]) on 06/11/2024 3:34:28 PM    Echocardiogram 05/17/2024 ([facility]) NORMAL LEFT VENTRICULAR SYSTOLIC FUNCTION WITH MILD LVH  ESTIMATED EF: 55%  DIASTOLIC FUNCTION CAN'T BE DETERMINED  NORMAL RIGHT VENTRICULAR SYSTOLIC FUNCTION  VALVULAR REGURGITATION: TRIVIAL AR,  TRIVIAL MR, MILD PR, MILD TR  ESTIMATED RVSP: 26 mmHg (Normal)  VALVULAR STENOSIS: No AS, PROSTHETIC MV RING, No PS, No TS   Cardiac catheterization 01/04/2024   LV end diastolic pressure is normal.   Normal coronary anatomy Normal LV filling pressures. LVEDP  16 mm Hg. PCWP 18/17, mean 14 mm Hg Normal right heart pressures. PAP 35/10, mean 20 mm Hg Normal Cardiac output 4.64 L/min, index 2.91 Diagnostic Dominance: Right  TEE 01/02/2024  1. Left ventricular ejection fraction, by estimation, is 55 to 60%. The  left ventricle has normal function. The left ventricle has no regional  wall motion abnormalities.   2. Right ventricular systolic function is normal. The right ventricular  size is normal. There is normal pulmonary artery systolic pressure. The  estimated right ventricular systolic pressure is 29.0 mmHg.   3. Left atrial size was mildly dilated. No left atrial/left atrial  appendage thrombus was detected. The LAA emptying velocity was 64 cm/s.   4. Mitral valve area is 5.3 cm sq by planimetry. Ruptured chordae seen to  the middle (P2) scallop of the poesterior mitral leaflet, with flail  motion. Posterior leaflet length is 16 mm. Mitral regurgitation vena  contracta is 5 mm. By the PISA method,  the effective regrurgitant orifice area is 0.46 cm sq, regurgitant volume  80 ml, regurgitant fraction 66%. The mitral valve is myxomatous. Severe  mitral valve regurgitation. No evidence of mitral stenosis. There is  severe prolapse of the middle scallop  of the posterior leaflet of the mitral valve. The mean mitral valve  gradient is 3.0 mmHg with average heart rate of 90 bpm.   5. The tricuspid valve is myxomatous.   6. The aortic valve is tricuspid. Aortic valve regurgitation is trivial.  No aortic stenosis is present.   7. 3D performed of the mitral valve and demonstrates prolapse of both  mitral leaflets with flail P2 scallop.   Echocardiogram 12/12/2023 1. APical window is foreshortened.. Left ventricular ejection fraction,  by estimation, is 55 to 60%. The left ventricle has normal function.   2. Right ventricular systolic function is normal. The right ventricular  size is normal. There is normal pulmonary artery systolic pressure.    3. Left atrial size was moderately dilated.   4. The mitral valve is myxomatous. torrential mitral valve regurgitation.  There is mild late systolic prolapse of both leaflets of the mitral valve.   5. The aortic valve is tricuspid. Aortic valve regurgitation is not  visualized. Aortic valve sclerosis/calcification is present, without any  evidence of aortic stenosis.   6. The inferior vena cava is normal in size with greater than 50%  respiratory variability, suggesting right atrial pressure of 3 mmHg.   Risk Assessment/Calculations:    CHA2DS2-VASc Score = 2   This indicates a 2.2% annual risk of stroke. The patient's score is based upon: CHF History: 0 HTN History: 0 Diabetes History: 0 Stroke History: 0 Vascular Disease History: 0 Age Score: 1 Gender Score: 1              Physical Exam:   VS:  BP (!) 102/58 (BP Location: Right Arm, Patient Position: Sitting, Cuff Size: Normal)   Pulse 89   Ht 5' 4.5 (1.638 m)   Wt 130 lb (59 kg)   BMI 21.97 kg/m    Wt Readings from Last 3 Encounters:  06/11/24 130 lb 3.2 oz (59.1 kg)  06/11/24 130 lb (59 kg)  06/01/24 128 lb (58.1 kg)  GEN: Well nourished, well developed in no acute distress NECK: No JVD; No carotid bruits CARDIAC: RRR.  1/6 murmur.  No rubs, gallops RESPIRATORY:  Clear to auscultation without rales, wheezing or rhonchi  ABDOMEN: Soft, non-tender, non-distended EXTREMITIES:  No edema; No acute deformity      Assessment and Plan:  Severe mitral valve regurgitation s/p MV repair Underwent MV repair (36mm Simulus semi rigid band) via HP on 05/13/2024 at [facility] Postoperative echocardiogram 05/17/2024 with normal LVEF of 55% with trivial MR, no AS, prosthetic MV ring - Today she continues to progress well without chest pains, dyspnea, or syncope - Incision sites are healing properly without signs of infection or hematoma - SBE prophylaxis for any dental or invasive procedures - Continue aspirin  81 mg daily - Okay to  begin cardiac rehab  Atrial fibrillation On 06/01/2024 she was found to be in A-fib with RVR by EMS, was given 10 mg of diltiazem and converted to normal sinus rhythm prior to ED arrival - She had 1 episode of A-fib on 10/11 and is s/p MVR 9/22.  This is possibly secondary to recent surgery - EKG today shows patient is maintaining normal sinus rhythm - She is without symptoms of recurrent atrial fibrillation and is monitoring heart rhythm with Fitbit watch - She has been evaluated by atrial fibrillation clinic with plan to prefer conservative observation via ZIO prior to committing to anticoagulation - Pending 14-day ZIO to assess atrial fibrillation burden - Continue aspirin  81 mg daily  Hyperlipidemia Cardiac catheterization with normal coronary arteries She prefers not to take any cholesterol-lowering agents which is reasonable    Cardiac Rehabilitation Eligibility Assessment  The patient is ready to start cardiac rehabilitation from a cardiac standpoint.     Dispo:  Return in about 3 months (around 09/11/2024).  Signed, Lum LITTIE Louis, NP

## 2024-06-11 NOTE — Patient Instructions (Addendum)
 Medication Instructions:  NO CHANGES  Lab Work: NONE TO BE DONE TODAY.  Testing/Procedures: NONE  Follow-Up: At [facility], you and your health needs are our priority.  As part of our continuing mission to provide you with exceptional heart care, our providers are all part of one team.  This team includes your primary Cardiologist (physician) and Advanced Practice Providers or APPs (Physician Assistants and Nurse Practitioners) who all work together to provide you with the care you need, when you need it.  Your next appointment:   3 MONTHS  Provider:   Jerel Balding, MD

## 2024-06-11 NOTE — Progress Notes (Signed)
 Primary Care Physician: Clarice Nottingham, MD Primary Cardiologist: Jerel Balding, MD Electrophysiologist: None     Referring Physician: ED     Beth Chapman is a 73 y.o. female with a history of s/p MVR on 05/13/24 at [facility], HLD, and atrial fibrillation who presents for consultation in the [facility]. ED visit on 10/11 for new Afib with RVR converted to NSR prior to ED arrival after given 10 mg of diltiazem by EMS. Patient is on ASA for stroke prevention.  On evaluation today, patient is currently in NSR. She has had no Afib episodes since ED visit. She uses a Fitbit watch which performs ECG to confirm. Patient notes at surgery follow up she was notified it was not a surprise to find out she had post-op Afib. It was recommended to observe for additional arrhythmia burden before considering anticoagulation.  EMS ECG:    Today, she denies symptoms of palpitations, chest pain, shortness of breath, orthopnea, PND, lower extremity edema, dizziness, presyncope, syncope, bleeding, or neurologic sequela. The patient is tolerating medications without difficulties and is otherwise without complaint today.    she has a BMI of Body mass index is 24.2 kg/m.SABRA Filed Weights   06/11/24 1505  Weight: 59.1 kg    Current Outpatient Medications  Medication Sig Dispense Refill   acetaminophen  (TYLENOL ) 500 MG tablet Take 1,000 mg by mouth every 6 (six) hours as needed for moderate pain (pain score 4-6).     aspirin  81 MG chewable tablet Chew 81 mg by mouth daily.     Cyanocobalamin (B-12 PO) Take 1 capsule by mouth daily.     cyclobenzaprine (FLEXERIL) 10 MG tablet TAKE 1 TABLET BY MOUTH IN THE EVENING AS NEEDED     fexofenadine-pseudoephedrine (ALLEGRA-D) 60-120 MG 12 hr tablet Take 1 tablet by mouth daily as needed (allergies).     MAGNESIUM GLYCINATE PO Take 2 capsules by mouth daily.     meloxicam (MOBIC) 15 MG tablet Take 15 mg by mouth daily as needed for pain.      metoprolol tartrate (LOPRESSOR) 25 MG tablet Take 12.5 mg by mouth 2 (two) times daily.     Multiple Vitamin (MULTIVITAMIN) tablet Take 1 tablet by mouth daily.     Omega-3 Fatty Acids (FISH OIL) 1200 MG CAPS 1 capsule.     Turmeric 500 MG TABS takes 2400 mg Orally     TURMERIC-GINGER PO Take 2 capsules by mouth daily.     VITAMIN D-VITAMIN K PO Take 2 capsules by mouth daily.     Current Facility-Administered Medications  Medication Dose Route Frequency Provider Last Rate Last Admin   0.9 %  sodium chloride  infusion  500 mL Intravenous Once Abran Norleen SAILOR, MD        Atrial Fibrillation Management history:  Previous antiarrhythmic drugs: none Previous cardioversions: none Previous ablations: none Anticoagulation history: none   ROS- All systems are reviewed and negative except as per the HPI above.  Physical Exam: BP 114/76   Pulse 76   Ht 5' 1.5 (1.562 m)   Wt 59.1 kg   SpO2 98%   BMI 24.20 kg/m   GEN: Well nourished, well developed in no acute distress NECK: No JVD; No carotid bruits CARDIAC: Regular rate and rhythm, no murmurs, rubs, gallops RESPIRATORY:  Clear to auscultation without rales, wheezing or rhonchi  ABDOMEN: Soft, non-tender, non-distended EXTREMITIES:  No edema; No deformity   EKG today demonstrates  Vent. rate 89 BPM PR interval  138 ms QRS duration 92 ms QT/QTcB 354/430 ms P-R-T axes 43 62 81 Sinus rhythm with occasional Premature ventricular complexes Low voltage QRS Incomplete right bundle branch block Nonspecific T wave abnormality When compared with ECG of 01-Jun-2024 01:07, PREVIOUS ECG IS PRESENT  Echo 05/17/24 ([facility]) demonstrated  CONCLUSION -------------------------------------------------------------------------------  NORMAL LEFT VENTRICULAR SYSTOLIC FUNCTION WITH MILD LVH  ESTIMATED EF: 55%  DIASTOLIC FUNCTION CAN'T BE DETERMINED  NORMAL RIGHT VENTRICULAR SYSTOLIC FUNCTION  VALVULAR REGURGITATION: TRIVIAL AR, TRIVIAL MR, MILD  PR, MILD TR  ESTIMATED RVSP: 26 mmHg (Normal)  VALVULAR STENOSIS: No AS, PROSTHETIC MV RING, No PS, No TS   ASSESSMENT & PLAN CHA2DS2-VASc Score = 2  The patient's score is based upon: CHF History: 0 HTN History: 0 Diabetes History: 0 Stroke History: 0 Vascular Disease History: 0 Age Score: 1 Gender Score: 1       ASSESSMENT AND PLAN: Paroxysmal Atrial Fibrillation (ICD10:  I48.0) The patient's CHA2DS2-VASc score is 2, indicating a 2.2% annual risk of stroke.    Patient had one new episode of Afib on 10/11 and is s/p MVR on 9/22. We hope this is only secondary to recent surgery. Will place Zio for 2 weeks to assess for Afib burden. At this time, we will not begin anticoagulation and prefer conservative observation via Zio for further information before committing patient to anticoagulation. She is in agreement. Continue ASA 81 mg daily.     Follow up based on monitor results.    [facility], [facility] [address] [place], [place] [address] [phone]

## 2024-06-12 ENCOUNTER — Other Ambulatory Visit (HOSPITAL_COMMUNITY): Payer: Self-pay | Admitting: *Deleted

## 2024-06-12 DIAGNOSIS — Z9889 Other specified postprocedural states: Secondary | ICD-10-CM

## 2024-06-14 ENCOUNTER — Telehealth (HOSPITAL_COMMUNITY): Payer: Self-pay

## 2024-06-14 NOTE — Telephone Encounter (Signed)
 Pt insurance is active and benefits verified through [employer]. Co-pay $15, DED $0/$0 met, out of pocket $3,400/$559.32 met, co-insurance 0%. No pre-authorization required. 06/14/2024 @ 10:40am, spoke with Lona, REF# [id number].  TCR/ICR? ICR Visit(date of service)limitation? No Can multiple codes be used on the same date of service/visit?(IF ITS A LIMIT) N/A  Is this a lifetime maximum or an annual maximum? Annual Has the member used any of these services to date? No Is there a time limit (weeks/months) on start of program and/or program completion? No

## 2024-06-14 NOTE — Telephone Encounter (Signed)
 Patient left message returning call to schedule cardiac rehab. Attempted to call patient back- no answer, left message.

## 2024-06-14 NOTE — Telephone Encounter (Signed)
 Attempted to call patient to schedule cardiac rehab- no answer, left message. Sent MyChart message.

## 2024-06-17 ENCOUNTER — Telehealth (HOSPITAL_COMMUNITY): Payer: Self-pay

## 2024-06-17 NOTE — Telephone Encounter (Signed)
 Patient called back to get scheduled in the Cardiac Rehab Program. Patient will come in for orientation on 11/25 and will attend the 10:15 exercise class.  Sent MyChart message.

## 2024-06-26 DIAGNOSIS — Z0000 Encounter for general adult medical examination without abnormal findings: Secondary | ICD-10-CM | POA: Diagnosis not present

## 2024-06-26 DIAGNOSIS — R7303 Prediabetes: Secondary | ICD-10-CM | POA: Diagnosis not present

## 2024-07-01 ENCOUNTER — Ambulatory Visit (HOSPITAL_COMMUNITY): Payer: Self-pay | Admitting: Internal Medicine

## 2024-07-01 DIAGNOSIS — I4891 Unspecified atrial fibrillation: Secondary | ICD-10-CM | POA: Diagnosis not present

## 2024-07-01 NOTE — Addendum Note (Signed)
 Encounter addended by: Franchot Glade RAMAN, RN on: 07/01/2024 12:01 PM  Actions taken: Imaging Exam ended

## 2024-07-03 DIAGNOSIS — M5090 Cervical disc disorder, unspecified, unspecified cervical region: Secondary | ICD-10-CM | POA: Diagnosis not present

## 2024-07-03 DIAGNOSIS — M545 Low back pain, unspecified: Secondary | ICD-10-CM | POA: Diagnosis not present

## 2024-07-03 DIAGNOSIS — R7303 Prediabetes: Secondary | ICD-10-CM | POA: Diagnosis not present

## 2024-07-03 DIAGNOSIS — G8929 Other chronic pain: Secondary | ICD-10-CM | POA: Diagnosis not present

## 2024-07-03 DIAGNOSIS — Z952 Presence of prosthetic heart valve: Secondary | ICD-10-CM | POA: Diagnosis not present

## 2024-07-03 DIAGNOSIS — Z0000 Encounter for general adult medical examination without abnormal findings: Secondary | ICD-10-CM | POA: Diagnosis not present

## 2024-07-03 DIAGNOSIS — K862 Cyst of pancreas: Secondary | ICD-10-CM | POA: Diagnosis not present

## 2024-07-03 DIAGNOSIS — E7800 Pure hypercholesterolemia, unspecified: Secondary | ICD-10-CM | POA: Diagnosis not present

## 2024-07-03 DIAGNOSIS — Z820 Family history of epilepsy and other diseases of the nervous system: Secondary | ICD-10-CM | POA: Diagnosis not present

## 2024-07-03 DIAGNOSIS — Z8679 Personal history of other diseases of the circulatory system: Secondary | ICD-10-CM | POA: Diagnosis not present

## 2024-07-03 DIAGNOSIS — G43909 Migraine, unspecified, not intractable, without status migrainosus: Secondary | ICD-10-CM | POA: Diagnosis not present

## 2024-07-15 ENCOUNTER — Telehealth (HOSPITAL_COMMUNITY): Payer: Self-pay

## 2024-07-15 NOTE — Telephone Encounter (Signed)
 Attempted to confirm cardiac rehab orientation date of 07/16/24 @ 1030.  Location, what to bring, footwear, and approximate length of orientation given in message.

## 2024-07-16 ENCOUNTER — Encounter (HOSPITAL_COMMUNITY)
Admission: RE | Admit: 2024-07-16 | Discharge: 2024-07-16 | Disposition: A | Discharge status: Home / Self Care | Source: Ambulatory Visit | Attending: Cardiovascular Disease | Admitting: Cardiovascular Disease

## 2024-07-16 ENCOUNTER — Encounter (HOSPITAL_COMMUNITY): Payer: Self-pay

## 2024-07-16 VITALS — BP 104/78 | HR 84 | Ht 62.75 in | Wt 134.3 lb

## 2024-07-16 DIAGNOSIS — Z9889 Other specified postprocedural states: Secondary | ICD-10-CM | POA: Insufficient documentation

## 2024-07-16 NOTE — Progress Notes (Signed)
 Cardiac Rehab Medication Review by a Nurse  Does the patient  feel that his/her medications are working for him/her?  YES  Has the patient been experiencing any side effects to the medications prescribed?   NO:  Does the patient measure his/her own blood pressure or blood glucose at home?  YES   Does the patient have any problems obtaining medications due to transportation or finances?    NO  Understanding of regimen: excellent Understanding of indications: excellent Potential of compliance: good    Nurse comments: [name] is taking her medications as prescribed and has a good understanding of what her medications are for. [name] checks her blood pressures once a day.    Hadassah Gaw [facility] RN 07/16/2024 10:34 AM

## 2024-07-16 NOTE — Progress Notes (Signed)
 Cardiac Individual Treatment Plan  Patient Details  Name: Beth Chapman MRN: [id number] Date of Birth: 12-18-1950 Referring Provider:   Flowsheet Row INTENSIVE CARDIAC REHAB ORIENT from 07/16/2024 in [facility]  Referring Provider Croitoru, Jerel, MD    Initial Encounter Date:  Flowsheet Row INTENSIVE CARDIAC REHAB ORIENT from 07/16/2024 in [facility]  Date 07/16/24    Visit Diagnosis: S/P mitral valve repair at [facility], Dr Ricky  Patient's Home Medications on Admission:  Current Outpatient Medications:    acetaminophen  (TYLENOL ) 500 MG tablet, Take 1,000 mg by mouth every 6 (six) hours as needed for moderate pain (pain score 4-6)., Disp: , Rfl:    aspirin  81 MG chewable tablet, Chew 81 mg by mouth daily., Disp: , Rfl:    Cyanocobalamin (B-12 PO), Take 1 capsule by mouth daily., Disp: , Rfl:    cyclobenzaprine (FLEXERIL) 10 MG tablet, TAKE 1 TABLET BY MOUTH IN THE EVENING AS NEEDED, Disp: , Rfl:    fexofenadine-pseudoephedrine (ALLEGRA-D) 60-120 MG 12 hr tablet, Take 1 tablet by mouth daily as needed (allergies)., Disp: , Rfl:    MAGNESIUM GLYCINATE PO, Take 2 capsules by mouth daily., Disp: , Rfl:    meloxicam (MOBIC) 15 MG tablet, Take 15 mg by mouth daily as needed for pain., Disp: , Rfl:    metoprolol tartrate (LOPRESSOR) 25 MG tablet, Take 12.5 mg by mouth 2 (two) times daily., Disp: , Rfl:    Multiple Vitamin (MULTIVITAMIN) tablet, Take 1 tablet by mouth daily., Disp: , Rfl:    Omega-3 Fatty Acids (FISH OIL) 1200 MG CAPS, 1 capsule., Disp: , Rfl:    TURMERIC-GINGER PO, Take 2 capsules by mouth daily., Disp: , Rfl:    VITAMIN D-VITAMIN K PO, Take 2 capsules by mouth daily., Disp: , Rfl:    Turmeric 500 MG TABS, takes 2400 mg Orally (Patient not taking: Reported on 07/16/2024), Disp: , Rfl:   Current Facility-Administered Medications:    0.9 %  sodium chloride   infusion, 500 mL, Intravenous, Once, Abran Norleen SAILOR, MD  Past Medical History: Past Medical History:  Diagnosis Date   Allergy    Anxiety    Arthritis    Classic migraine 11/12/2013   Heart murmur    Migraines     Tobacco Use: Social History   Tobacco Use  Smoking Status Never  Smokeless Tobacco Never  Tobacco Comments   Never smoked 06/11/24    Labs: Review Flowsheet       Latest Ref Rng & Units 01/04/2024  Labs for ITP Cardiac and Pulmonary Rehab  PH, Arterial 7.35 - 7.45 7.407   PCO2 arterial 32 - 48 mmHg 35.8   Bicarbonate 20.0 - 28.0 mmol/L 22.6  24.8  23.7   TCO2 22 - 32 mmol/L 24  26  25    Acid-base deficit 0.0 - 2.0 mmol/L 2.0  1.0   O2 Saturation % 96  72  73     Details       Multiple values from one day are sorted in reverse-chronological order         Capillary Blood Glucose: No results found for: GLUCAP   Exercise Target Goals: Exercise Program Goal: Individual exercise prescription set using results from initial 6 min walk test and THRR while considering  patient's activity barriers and safety.   Exercise Prescription Goal: Initial exercise prescription builds to 30-45 minutes a day of aerobic activity,  2-3 days per week.  Home exercise guidelines will be given to patient during program as part of exercise prescription that the participant will acknowledge.  Activity Barriers & Risk Stratification:  Activity Barriers & Cardiac Risk Stratification - 07/16/24 1155       Activity Barriers & Cardiac Risk Stratification   Activity Barriers Back Problems;Neck/Spine Problems;Shortness of Breath;Other (comment)    Comments Ruptured disc in neck and low back, left hip pain, OA.    Cardiac Risk Stratification Low          6 Minute Walk:  6 Minute Walk     Row Name 07/16/24 1210         6 Minute Walk   Phase Initial     Distance 1613 feet     Walk Time 6 minutes     # of Rest Breaks 0     MPH 3.05     METS 3.71     RPE 11      Perceived Dyspnea  1     VO2 Peak 12.99     Symptoms Yes (comment)     Comments Mild shortness of breath.     Resting HR 84 bpm     Resting BP 104/78     Resting Oxygen Saturation  97 %     Exercise Oxygen Saturation  during 6 min walk 100 %     Max Ex. HR 122 bpm     Max Ex. BP 132/62     2 Minute Post BP 128/78        Oxygen Initial Assessment:   Oxygen Re-Evaluation:   Oxygen Discharge (Final Oxygen Re-Evaluation):   Initial Exercise Prescription:  Initial Exercise Prescription - 07/16/24 1200       Date of Initial Exercise RX and Referring Provider   Date 07/16/24    Referring Provider Croitoru, Jerel, MD    Expected Discharge Date 10/09/24      Recumbant Bike   Level 1    RPM 30    Watts 20    Minutes 15    METs 3      NuStep   Level 1    SPM 85    Minutes 15    METs 3      Prescription Details   Frequency (times per week) 3    Duration Progress to 30 minutes of continuous aerobic without signs/symptoms of physical distress      Intensity   THRR 40-80% of Max Heartrate 60-119    Ratings of Perceived Exertion 11-13    Perceived Dyspnea 0-4      Progression   Progression Continue to progress workloads to maintain intensity without signs/symptoms of physical distress.      Resistance Training   Training Prescription Yes    Weight 2 lbs    Reps 10-15          Perform Capillary Blood Glucose checks as needed.  Exercise Prescription Changes:   Exercise Comments:   Exercise Goals and Review:   Exercise Goals     Row Name 07/16/24 1155             Exercise Goals   Increase Physical Activity Yes       Intervention Provide advice, education, support and counseling about physical activity/exercise needs.;Develop an individualized exercise prescription for aerobic and resistive training based on initial evaluation findings, risk stratification, comorbidities and participant's personal goals.       Expected Outcomes Short Term: Attend  rehab  on a regular basis to increase amount of physical activity.;Long Term: Add in home exercise to make exercise part of routine and to increase amount of physical activity.;Long Term: Exercising regularly at least 3-5 days a week.       Increase Strength and Stamina Yes       Intervention Provide advice, education, support and counseling about physical activity/exercise needs.;Develop an individualized exercise prescription for aerobic and resistive training based on initial evaluation findings, risk stratification, comorbidities and participant's personal goals.       Expected Outcomes Short Term: Increase workloads from initial exercise prescription for resistance, speed, and METs.;Short Term: Perform resistance training exercises routinely during rehab and add in resistance training at home;Long Term: Improve cardiorespiratory fitness, muscular endurance and strength as measured by increased METs and functional capacity ( )       Able to understand and use rate of perceived exertion (RPE) scale Yes       Intervention Provide education and explanation on how to use RPE scale       Expected Outcomes Short Term: Able to use RPE daily in rehab to express subjective intensity level;Long Term:  Able to use RPE to guide intensity level when exercising independently       Knowledge and understanding of Target Heart Rate Range (THRR) Yes       Intervention Provide education and explanation of THRR including how the numbers were predicted and where they are located for reference       Expected Outcomes Short Term: Able to state/look up THRR;Long Term: Able to use THRR to govern intensity when exercising independently;Short Term: Able to use daily as guideline for intensity in rehab       Able to check pulse independently Yes       Intervention Provide education and demonstration on how to check pulse in carotid and radial arteries.;Review the importance of being able to check your own pulse for safety  during independent exercise       Expected Outcomes Short Term: Able to explain why pulse checking is important during independent exercise;Long Term: Able to check pulse independently and accurately       Understanding of Exercise Prescription Yes       Intervention Provide education, explanation, and written materials on patient's individual exercise prescription       Expected Outcomes Short Term: Able to explain program exercise prescription;Long Term: Able to explain home exercise prescription to exercise independently          Exercise Goals Re-Evaluation :   Discharge Exercise Prescription (Final Exercise Prescription Changes):   Nutrition:  Target Goals: Understanding of nutrition guidelines, daily intake of sodium 1500mg , cholesterol 200mg , calories 30% from fat and 7% or less from saturated fats, daily to have 5 or more servings of fruits and vegetables.  Biometrics:  Pre Biometrics - 07/16/24 1257       Pre Biometrics   Height 5' 2.75 (1.594 m)    Weight 60.9 kg    Waist Circumference 31 inches    Hip Circumference 37.5 inches    Waist to Hip Ratio 0.83 %    BMI (Calculated) 23.97    Triceps Skinfold 22 mm    % Body Fat 34.8 %    Grip Strength 14 kg    Flexibility --   Not performed. Ruptured disc in neck and low back.   Single Leg Stand 30 seconds           Nutrition Therapy Plan and Nutrition  Goals:   Nutrition Assessments:  MEDIFICTS Score Key: >=70 Need to make dietary changes  40-70 Heart Healthy Diet <= 40 Therapeutic Level Cholesterol Diet    Picture Your Plate Scores: <59 Unhealthy dietary pattern with much room for improvement. 41-50 Dietary pattern unlikely to meet recommendations for good health and room for improvement. 51-60 More healthful dietary pattern, with some room for improvement.  >60 Healthy dietary pattern, although there may be some specific behaviors that could be improved.    Nutrition Goals Re-Evaluation:   Nutrition  Goals Re-Evaluation:   Nutrition Goals Discharge (Final Nutrition Goals Re-Evaluation):   Psychosocial: Target Goals: Acknowledge presence or absence of significant depression and/or stress, maximize coping skills, provide positive support system. Participant is able to verbalize types and ability to use techniques and skills needed for reducing stress and depression.  Initial Review & Psychosocial Screening:  Initial Psych Review & Screening - 07/16/24 1108       Initial Review   Current issues with None Identified      Family Dynamics   Good Support System? Yes   [name] lives with her husband who she has for support. [name] also has friends that she can rely on. [name]'s 39 year old mother lives out of town     Barriers   Psychosocial barriers to participate in program The patient should benefit from training in stress management and relaxation.      Screening Interventions   Interventions Encouraged to exercise    Expected Outcomes Long Term goal: The participant improves quality of Life and PHQ9 Scores as seen by post scores and/or verbalization of changes;Short Term goal: Identification and review with participant of any Quality of Life or Depression concerns found by scoring the questionnaire.          Quality of Life Scores:  Quality of Life - 07/16/24 1416       Quality of Life   Select Quality of Life      Quality of Life Scores   Health/Function Pre 22.4 %    Socioeconomic Pre 26.57 %    Psych/Spiritual Pre 24.86 %    Family Pre 25.5 %    GLOBAL Pre 24.18 %         Scores of 19 and below usually indicate a poorer quality of life in these areas.  A difference of  2-3 points is a clinically meaningful difference.  A difference of 2-3 points in the total score of the Quality of Life Index has been associated with significant improvement in overall quality of life, self-image, physical symptoms, and general health in studies assessing change in quality of  life.  PHQ-9: Review Flowsheet       07/16/2024  Depression screen PHQ 2/9  Decreased Interest 0  Down, Depressed, Hopeless 0  PHQ - 2 Score 0  Altered sleeping 1  Tired, decreased energy 1  Change in appetite 0  Feeling bad or failure about yourself  0  Trouble concentrating 0  Moving slowly or fidgety/restless 0  Suicidal thoughts 0  PHQ-9 Score 2  Difficult doing work/chores Not difficult at all   Interpretation of Total Score  Total Score Depression Severity:  1-4 = Minimal depression, 5-9 = Mild depression, 10-14 = Moderate depression, 15-19 = Moderately severe depression, 20-27 = Severe depression   Psychosocial Evaluation and Intervention:   Psychosocial Re-Evaluation:   Psychosocial Discharge (Final Psychosocial Re-Evaluation):   Vocational Rehabilitation: Provide vocational rehab assistance to qualifying candidates.   Vocational Rehab Evaluation &  Intervention:  Vocational Rehab - 07/16/24 1110       Initial Vocational Rehab Evaluation & Intervention   Assessment shows need for Vocational Rehabilitation No   [name] is retired and does not need vocational rehab at this time         Education: Education Goals: Education classes will be provided on a weekly basis, covering required topics. Participant will state understanding/return demonstration of topics presented.     Core Videos: Exercise    Move It!  Clinical staff conducted group or individual video education with verbal and written material and guidebook.  Patient learns the recommended Pritikin exercise program. Exercise with the goal of living a long, healthy life. Some of the health benefits of exercise include controlled diabetes, healthier blood pressure levels, improved cholesterol levels, improved heart and lung capacity, improved sleep, and better body composition. Everyone should speak with their doctor before starting or changing an exercise routine.  Biomechanical Limitations Clinical  staff conducted group or individual video education with verbal and written material and guidebook.  Patient learns how biomechanical limitations can impact exercise and how we can mitigate and possibly overcome limitations to have an impactful and balanced exercise routine.  Body Composition Clinical staff conducted group or individual video education with verbal and written material and guidebook.  Patient learns that body composition (ratio of muscle mass to fat mass) is a key component to assessing overall fitness, rather than body weight alone. Increased fat mass, especially visceral belly fat, can put us  at increased risk for metabolic syndrome, type 2 diabetes, heart disease, and even death. It is recommended to combine diet and exercise (cardiovascular and resistance training) to improve your body composition. Seek guidance from your physician and exercise physiologist before implementing an exercise routine.  Exercise Action Plan Clinical staff conducted group or individual video education with verbal and written material and guidebook.  Patient learns the recommended strategies to achieve and enjoy long-term exercise adherence, including variety, self-motivation, self-efficacy, and positive decision making. Benefits of exercise include fitness, good health, weight management, more energy, better sleep, less stress, and overall well-being.  Medical   Heart Disease Risk Reduction Clinical staff conducted group or individual video education with verbal and written material and guidebook.  Patient learns our heart is our most vital organ as it circulates oxygen, nutrients, white blood cells, and hormones throughout the entire body, and carries waste away. Data supports a plant-based eating plan like the Pritikin Program for its effectiveness in slowing progression of and reversing heart disease. The video provides a number of recommendations to address heart disease.   Metabolic Syndrome and  Belly Fat  Clinical staff conducted group or individual video education with verbal and written material and guidebook.  Patient learns what metabolic syndrome is, how it leads to heart disease, and how one can reverse it and keep it from coming back. You have metabolic syndrome if you have 3 of the following 5 criteria: abdominal obesity, high blood pressure, high triglycerides, low HDL cholesterol, and high blood sugar.  Hypertension and Heart Disease Clinical staff conducted group or individual video education with verbal and written material and guidebook.  Patient learns that high blood pressure, or hypertension, is very common in the [place] . Hypertension is largely due to excessive salt intake, but other important risk factors include being overweight, physical inactivity, drinking too much alcohol, smoking, and not eating enough potassium from fruits and vegetables. High blood pressure is a leading risk factor for heart attack, stroke,  congestive heart failure, dementia, kidney failure, and premature death. Long-term effects of excessive salt intake include stiffening of the arteries and thickening of heart muscle and organ damage. Recommendations include ways to reduce hypertension and the risk of heart disease.  Diseases of Our Time - Focusing on Diabetes Clinical staff conducted group or individual video education with verbal and written material and guidebook.  Patient learns why the best way to stop diseases of our time is prevention, through food and other lifestyle changes. Medicine (such as prescription pills and surgeries) is often only a Band-Aid on the problem, not a long-term solution. Most common diseases of our time include obesity, type 2 diabetes, hypertension, heart disease, and cancer. The Pritikin Program is recommended and has been proven to help reduce, reverse, and/or prevent the damaging effects of metabolic syndrome.  Nutrition   Overview of the Pritikin Eating Plan   Clinical staff conducted group or individual video education with verbal and written material and guidebook.  Patient learns about the Pritikin Eating Plan for disease risk reduction. The Pritikin Eating Plan emphasizes a wide variety of unrefined, minimally-processed carbohydrates, like fruits, vegetables, whole grains, and legumes. Go, Caution, and Stop food choices are explained. Plant-based and lean animal proteins are emphasized. Rationale provided for low sodium intake for blood pressure control, low added sugars for blood sugar stabilization, and low added fats and oils for coronary artery disease risk reduction and weight management.  Calorie Density  Clinical staff conducted group or individual video education with verbal and written material and guidebook.  Patient learns about calorie density and how it impacts the Pritikin Eating Plan. Knowing the characteristics of the food you choose will help you decide whether those foods will lead to weight gain or weight loss, and whether you want to consume more or less of them. Weight loss is usually a side effect of the Pritikin Eating Plan because of its focus on low calorie-dense foods.  Label Reading  Clinical staff conducted group or individual video education with verbal and written material and guidebook.  Patient learns about the Pritikin recommended label reading guidelines and corresponding recommendations regarding calorie density, added sugars, sodium content, and whole grains.  Dining Out - Part 1  Clinical staff conducted group or individual video education with verbal and written material and guidebook.  Patient learns that restaurant meals can be sabotaging because they can be so high in calories, fat, sodium, and/or sugar. Patient learns recommended strategies on how to positively address this and avoid unhealthy pitfalls.  Facts on Fats  Clinical staff conducted group or individual video education with verbal and written  material and guidebook.  Patient learns that lifestyle modifications can be just as effective, if not more so, as many medications for lowering your risk of heart disease. A Pritikin lifestyle can help to reduce your risk of inflammation and atherosclerosis (cholesterol build-up, or plaque, in the artery walls). Lifestyle interventions such as dietary choices and physical activity address the cause of atherosclerosis. A review of the types of fats and their impact on blood cholesterol levels, along with dietary recommendations to reduce fat intake is also included.  Nutrition Action Plan  Clinical staff conducted group or individual video education with verbal and written material and guidebook.  Patient learns how to incorporate Pritikin recommendations into their lifestyle. Recommendations include planning and keeping personal health goals in mind as an important part of their success.  Healthy Mind-Set    Healthy Minds, Bodies, Hearts  Clinical staff conducted  group or individual video education with verbal and written material and guidebook.  Patient learns how to identify when they are stressed. Video will discuss the impact of that stress, as well as the many benefits of stress management. Patient will also be introduced to stress management techniques. The way we think, act, and feel has an impact on our hearts.  How Our Thoughts Can Heal Our Hearts  Clinical staff conducted group or individual video education with verbal and written material and guidebook.  Patient learns that negative thoughts can cause depression and anxiety. This can result in negative lifestyle behavior and serious health problems. Cognitive behavioral therapy is an effective method to help control our thoughts in order to change and improve our emotional outlook.  Additional Videos:  Exercise    Improving Performance  Clinical staff conducted group or individual video education with verbal and written material and  guidebook.  Patient learns to use a non-linear approach by alternating intensity levels and lengths of time spent exercising to help burn more calories and lose more body fat. Cardiovascular exercise helps improve heart health, metabolism, hormonal balance, blood sugar control, and recovery from fatigue. Resistance training improves strength, endurance, balance, coordination, reaction time, metabolism, and muscle mass. Flexibility exercise improves circulation, posture, and balance. Seek guidance from your physician and exercise physiologist before implementing an exercise routine and learn your capabilities and proper form for all exercise.  Introduction to Yoga  Clinical staff conducted group or individual video education with verbal and written material and guidebook.  Patient learns about yoga, a discipline of the coming together of mind, breath, and body. The benefits of yoga include improved flexibility, improved range of motion, better posture and core strength, increased lung function, weight loss, and positive self-image. Yoga's heart health benefits include lowered blood pressure, healthier heart rate, decreased cholesterol and triglyceride levels, improved immune function, and reduced stress. Seek guidance from your physician and exercise physiologist before implementing an exercise routine and learn your capabilities and proper form for all exercise.  Medical   Aging: Enhancing Your Quality of Life  Clinical staff conducted group or individual video education with verbal and written material and guidebook.  Patient learns key strategies and recommendations to stay in good physical health and enhance quality of life, such as prevention strategies, having an advocate, securing a Health Care Proxy and Power of Attorney, and keeping a list of medications and system for tracking them. It also discusses how to avoid risk for bone loss.  Biology of Weight Control  Clinical staff conducted group or  individual video education with verbal and written material and guidebook.  Patient learns that weight gain occurs because we consume more calories than we burn (eating more, moving less). Even if your body weight is normal, you may have higher ratios of fat compared to muscle mass. Too much body fat puts you at increased risk for cardiovascular disease, heart attack, stroke, type 2 diabetes, and obesity-related cancers. In addition to exercise, following the Pritikin Eating Plan can help reduce your risk.  Decoding Lab Results  Clinical staff conducted group or individual video education with verbal and written material and guidebook.  Patient learns that lab test reflects one measurement whose values change over time and are influenced by many factors, including medication, stress, sleep, exercise, food, hydration, pre-existing medical conditions, and more. It is recommended to use the knowledge from this video to become more involved with your lab results and evaluate your numbers to speak with your  doctor.   Diseases of Our Time - Overview  Clinical staff conducted group or individual video education with verbal and written material and guidebook.  Patient learns that according to the CDC, 50% to 70% of chronic diseases (such as obesity, type 2 diabetes, elevated lipids, hypertension, and heart disease) are avoidable through lifestyle improvements including healthier food choices, listening to satiety cues, and increased physical activity.  Sleep Disorders Clinical staff conducted group or individual video education with verbal and written material and guidebook.  Patient learns how good quality and duration of sleep are important to overall health and well-being. Patient also learns about sleep disorders and how they impact health along with recommendations to address them, including discussing with a physician.  Nutrition  Dining Out - Part 2 Clinical staff conducted group or individual video  education with verbal and written material and guidebook.  Patient learns how to plan ahead and communicate in order to maximize their dining experience in a healthy and nutritious manner. Included are recommended food choices based on the type of restaurant the patient is visiting.   Fueling a Banker conducted group or individual video education with verbal and written material and guidebook.  There is a strong connection between our food choices and our health. Diseases like obesity and type 2 diabetes are very prevalent and are in large-part due to lifestyle choices. The Pritikin Eating Plan provides plenty of food and hunger-curbing satisfaction. It is easy to follow, affordable, and helps reduce health risks.  Menu Workshop  Clinical staff conducted group or individual video education with verbal and written material and guidebook.  Patient learns that restaurant meals can sabotage health goals because they are often packed with calories, fat, sodium, and sugar. Recommendations include strategies to plan ahead and to communicate with the manager, chef, or server to help order a healthier meal.  Planning Your Eating Strategy  Clinical staff conducted group or individual video education with verbal and written material and guidebook.  Patient learns about the Pritikin Eating Plan and its benefit of reducing the risk of disease. The Pritikin Eating Plan does not focus on calories. Instead, it emphasizes high-quality, nutrient-rich foods. By knowing the characteristics of the foods, we choose, we can determine their calorie density and make informed decisions.  Targeting Your Nutrition Priorities  Clinical staff conducted group or individual video education with verbal and written material and guidebook.  Patient learns that lifestyle habits have a tremendous impact on disease risk and progression. This video provides eating and physical activity recommendations based on your  personal health goals, such as reducing LDL cholesterol, losing weight, preventing or controlling type 2 diabetes, and reducing high blood pressure.  Vitamins and Minerals  Clinical staff conducted group or individual video education with verbal and written material and guidebook.  Patient learns different ways to obtain key vitamins and minerals, including through a recommended healthy diet. It is important to discuss all supplements you take with your doctor.   Healthy Mind-Set    Smoking Cessation  Clinical staff conducted group or individual video education with verbal and written material and guidebook.  Patient learns that cigarette smoking and tobacco addiction pose a serious health risk which affects millions of people. Stopping smoking will significantly reduce the risk of heart disease, lung disease, and many forms of cancer. Recommended strategies for quitting are covered, including working with your doctor to develop a successful plan.  Culinary   Becoming a Set Designer  conducted group or individual video education with verbal and written material and guidebook.  Patient learns that cooking at home can be healthy, cost-effective, quick, and puts them in control. Keys to cooking healthy recipes will include looking at your recipe, assessing your equipment needs, planning ahead, making it simple, choosing cost-effective seasonal ingredients, and limiting the use of added fats, salts, and sugars.  Cooking - Breakfast and Snacks  Clinical staff conducted group or individual video education with verbal and written material and guidebook.  Patient learns how important breakfast is to satiety and nutrition through the entire day. Recommendations include key foods to eat during breakfast to help stabilize blood sugar levels and to prevent overeating at meals later in the day. Planning ahead is also a key component.  Cooking - Educational Psychologist conducted  group or individual video education with verbal and written material and guidebook.  Patient learns eating strategies to improve overall health, including an approach to cook more at home. Recommendations include thinking of animal protein as a side on your plate rather than center stage and focusing instead on lower calorie dense options like vegetables, fruits, whole grains, and plant-based proteins, such as beans. Making sauces in large quantities to freeze for later and leaving the skin on your vegetables are also recommended to maximize your experience.  Cooking - Healthy Salads and Dressing Clinical staff conducted group or individual video education with verbal and written material and guidebook.  Patient learns that vegetables, fruits, whole grains, and legumes are the foundations of the Pritikin Eating Plan. Recommendations include how to incorporate each of these in flavorful and healthy salads, and how to create homemade salad dressings. Proper handling of ingredients is also covered. Cooking - Soups and [employer] - Soups and Desserts Clinical staff conducted group or individual video education with verbal and written material and guidebook.  Patient learns that Pritikin soups and desserts make for easy, nutritious, and delicious snacks and meal components that are low in sodium, fat, sugar, and calorie density, while high in vitamins, minerals, and filling fiber. Recommendations include simple and healthy ideas for soups and desserts.   Overview     The Pritikin Solution Program Overview Clinical staff conducted group or individual video education with verbal and written material and guidebook.  Patient learns that the results of the Pritikin Program have been documented in more than 100 articles published in peer-reviewed journals, and the benefits include reducing risk factors for (and, in some cases, even reversing) high cholesterol, high blood pressure, type 2 diabetes, obesity,  and more! An overview of the three key pillars of the Pritikin Program will be covered: eating well, doing regular exercise, and having a healthy mind-set.  WORKSHOPS  Exercise: Exercise Basics: Building Your Action Plan Clinical staff led group instruction and group discussion with PowerPoint presentation and patient guidebook. To enhance the learning environment the use of posters, models and videos may be added. At the conclusion of this workshop, patients will comprehend the difference between physical activity and exercise, as well as the benefits of incorporating both, into their routine. Patients will understand the FITT (Frequency, Intensity, Time, and Type) principle and how to use it to build an exercise action plan. In addition, safety concerns and other considerations for exercise and cardiac rehab will be addressed by the presenter. The purpose of this lesson is to promote a comprehensive and effective weekly exercise routine in order to improve patients' overall level of fitness.  Managing Heart Disease: Your Path to a Healthier Heart Clinical staff led group instruction and group discussion with PowerPoint presentation and patient guidebook. To enhance the learning environment the use of posters, models and videos may be added.At the conclusion of this workshop, patients will understand the anatomy and physiology of the heart. Additionally, they will understand how Pritikin's three pillars impact the risk factors, the progression, and the management of heart disease.  The purpose of this lesson is to provide a high-level overview of the heart, heart disease, and how the Pritikin lifestyle positively impacts risk factors.  Exercise Biomechanics Clinical staff led group instruction and group discussion with PowerPoint presentation and patient guidebook. To enhance the learning environment the use of posters, models and videos may be added. Patients will learn how the structural  parts of their bodies function and how these functions impact their daily activities, movement, and exercise. Patients will learn how to promote a neutral spine, learn how to manage pain, and identify ways to improve their physical movement in order to promote healthy living. The purpose of this lesson is to expose patients to common physical limitations that impact physical activity. Participants will learn practical ways to adapt and manage aches and pains, and to minimize their effect on regular exercise. Patients will learn how to maintain good posture while sitting, walking, and lifting.  Balance Training and Fall Prevention  Clinical staff led group instruction and group discussion with PowerPoint presentation and patient guidebook. To enhance the learning environment the use of posters, models and videos may be added. At the conclusion of this workshop, patients will understand the importance of their sensorimotor skills (vision, proprioception, and the vestibular system) in maintaining their ability to balance as they age. Patients will apply a variety of balancing exercises that are appropriate for their current level of function. Patients will understand the common causes for poor balance, possible solutions to these problems, and ways to modify their physical environment in order to minimize their fall risk. The purpose of this lesson is to teach patients about the importance of maintaining balance as they age and ways to minimize their risk of falling.  WORKSHOPS   Nutrition:  Fueling a Ship Broker led group instruction and group discussion with PowerPoint presentation and patient guidebook. To enhance the learning environment the use of posters, models and videos may be added. Patients will review the foundational principles of the Pritikin Eating Plan and understand what constitutes a serving size in each of the food groups. Patients will also learn Pritikin-friendly  foods that are better choices when away from home and review make-ahead meal and snack options. Calorie density will be reviewed and applied to three nutrition priorities: weight maintenance, weight loss, and weight gain. The purpose of this lesson is to reinforce (in a group setting) the key concepts around what patients are recommended to eat and how to apply these guidelines when away from home by planning and selecting Pritikin-friendly options. Patients will understand how calorie density may be adjusted for different weight management goals.  Mindful Eating  Clinical staff led group instruction and group discussion with PowerPoint presentation and patient guidebook. To enhance the learning environment the use of posters, models and videos may be added. Patients will briefly review the concepts of the Pritikin Eating Plan and the importance of low-calorie dense foods. The concept of mindful eating will be introduced as well as the importance of paying attention to internal hunger signals. Triggers for non-hunger eating and  techniques for dealing with triggers will be explored. The purpose of this lesson is to provide patients with the opportunity to review the basic principles of the Pritikin Eating Plan, discuss the value of eating mindfully and how to measure internal cues of hunger and fullness using the Hunger Scale. Patients will also discuss reasons for non-hunger eating and learn strategies to use for controlling emotional eating.  Targeting Your Nutrition Priorities Clinical staff led group instruction and group discussion with PowerPoint presentation and patient guidebook. To enhance the learning environment the use of posters, models and videos may be added. Patients will learn how to determine their genetic susceptibility to disease by reviewing their family history. Patients will gain insight into the importance of diet as part of an overall healthy lifestyle in mitigating the impact of  genetics and other environmental insults. The purpose of this lesson is to provide patients with the opportunity to assess their personal nutrition priorities by looking at their family history, their own health history and current risk factors. Patients will also be able to discuss ways of prioritizing and modifying the Pritikin Eating Plan for their highest risk areas  Menu  Clinical staff led group instruction and group discussion with PowerPoint presentation and patient guidebook. To enhance the learning environment the use of posters, models and videos may be added. Using menus brought in from [employer], or printed from [employer], patients will apply the Pritikin dining out guidelines that were presented in the [employer] video. Patients will also be able to practice these guidelines in a variety of provided scenarios. The purpose of this lesson is to provide patients with the opportunity to practice hands-on learning of the Pritikin Dining Out guidelines with actual menus and practice scenarios.  Label Reading Clinical staff led group instruction and group discussion with PowerPoint presentation and patient guidebook. To enhance the learning environment the use of posters, models and videos may be added. Patients will review and discuss the Pritikin label reading guidelines presented in Pritikin's Label Reading Educational series video. Using fool labels brought in from local grocery stores and markets, patients will apply the label reading guidelines and determine if the packaged food meet the Pritikin guidelines. The purpose of this lesson is to provide patients with the opportunity to review, discuss, and practice hands-on learning of the Pritikin Label Reading guidelines with actual packaged food labels. Cooking School  Pritikin's [employer] are designed to teach patients ways to prepare quick, simple, and affordable recipes at home. The importance of  nutrition's role in chronic disease risk reduction is reflected in its emphasis in the overall Pritikin program. By learning how to prepare essential core Pritikin Eating Plan recipes, patients will increase control over what they eat; be able to customize the flavor of foods without the use of added salt, sugar, or fat; and improve the quality of the food they consume. By learning a set of core recipes which are easily assembled, quickly prepared, and affordable, patients are more likely to prepare more healthy foods at home. These workshops focus on convenient breakfasts, simple entres, side dishes, and desserts which can be prepared with minimal effort and are consistent with nutrition recommendations for cardiovascular risk reduction. Cooking [employer] are taught by a armed forces logistics/support/administrative officer (RD) who has been trained by the [employer]. The chef or RD has a clear understanding of the importance of minimizing - if not completely eliminating - added fat, sugar, and sodium in  recipes. Throughout the series of Cooking School Workshop sessions, patients will learn about healthy ingredients and efficient methods of cooking to build confidence in their capability to prepare    Cooking School weekly topics:  Adding Flavor- Sodium-Free  Fast and Healthy Breakfasts  Powerhouse Plant-Based Proteins  Satisfying Salads and Dressings  Simple Sides and Sauces  International Cuisine-Spotlight on the [employer] Zones  Delicious Desserts  Savory Soups  [employer] - Meals in a Astronomer Appetizers and Snacks  Comforting Weekend Breakfasts  One-Pot Wonders   Fast Evening Meals  Landscape Architect Your Pritikin Plate  WORKSHOPS   Healthy Mindset (Psychosocial):  Focused Goals, Sustainable Changes Clinical staff led group instruction and group discussion with PowerPoint presentation and patient guidebook. To enhance the learning environment the use of posters,  models and videos may be added. Patients will be able to apply effective goal setting strategies to establish at least one personal goal, and then take consistent, meaningful action toward that goal. They will learn to identify common barriers to achieving personal goals and develop strategies to overcome them. Patients will also gain an understanding of how our mind-set can impact our ability to achieve goals and the importance of cultivating a positive and growth-oriented mind-set. The purpose of this lesson is to provide patients with a deeper understanding of how to set and achieve personal goals, as well as the tools and strategies needed to overcome common obstacles which may arise along the way.  From Head to Heart: The Power of a Healthy Outlook  Clinical staff led group instruction and group discussion with PowerPoint presentation and patient guidebook. To enhance the learning environment the use of posters, models and videos may be added. Patients will be able to recognize and describe the impact of emotions and mood on physical health. They will discover the importance of self-care and explore self-care practices which may work for them. Patients will also learn how to utilize the 4 C's to cultivate a healthier outlook and better manage stress and challenges. The purpose of this lesson is to demonstrate to patients how a healthy outlook is an essential part of maintaining good health, especially as they continue their cardiac rehab journey.  Healthy Sleep for a Healthy Heart Clinical staff led group instruction and group discussion with PowerPoint presentation and patient guidebook. To enhance the learning environment the use of posters, models and videos may be added. At the conclusion of this workshop, patients will be able to demonstrate knowledge of the importance of sleep to overall health, well-being, and quality of life. They will understand the symptoms of, and treatments for, common sleep  disorders. Patients will also be able to identify daytime and nighttime behaviors which impact sleep, and they will be able to apply these tools to help manage sleep-related challenges. The purpose of this lesson is to provide patients with a general overview of sleep and outline the importance of quality sleep. Patients will learn about a few of the most common sleep disorders. Patients will also be introduced to the concept of "sleep hygiene," and discover ways to self-manage certain sleeping problems through simple daily behavior changes. Finally, the workshop will motivate patients by clarifying the links between quality sleep and their goals of heart-healthy living.   Recognizing and Reducing Stress Clinical staff led group instruction and group discussion with PowerPoint presentation and patient guidebook. To enhance the learning environment the use of posters, models and videos may be added. At the conclusion of this  workshop, patients will be able to understand the types of stress reactions, differentiate between acute and chronic stress, and recognize the impact that chronic stress has on their health. They will also be able to apply different coping mechanisms, such as reframing negative self-talk. Patients will have the opportunity to practice a variety of stress management techniques, such as deep abdominal breathing, progressive muscle relaxation, and/or guided imagery.  The purpose of this lesson is to educate patients on the role of stress in their lives and to provide healthy techniques for coping with it.  Learning Barriers/Preferences:  Learning Barriers/Preferences - 07/16/24 1110       Learning Barriers/Preferences   Learning Barriers Exercise Concerns   neck problems   Learning Preferences Video;Pictoral          Education Topics:  Knowledge Questionnaire Score:  Knowledge Questionnaire Score - 07/16/24 1417       Knowledge Questionnaire Score   Pre Score 24/24           Core Components/Risk Factors/Patient Goals at Admission:  Personal Goals and Risk Factors at Admission - 07/16/24 1155       Core Components/Risk Factors/Patient Goals on Admission   Lipids Yes    Intervention Provide education and support for participant on nutrition & aerobic/resistive exercise along with prescribed medications to achieve LDL 70mg , HDL >40mg .    Expected Outcomes Short Term: Participant states understanding of desired cholesterol values and is compliant with medications prescribed. Participant is following exercise prescription and nutrition guidelines.;Long Term: Cholesterol controlled with medications as prescribed, with individualized exercise RX and with personalized nutrition plan. Value goals: LDL < 70mg , HDL > 40 mg.          Core Components/Risk Factors/Patient Goals Review:    Core Components/Risk Factors/Patient Goals at Discharge (Final Review):    ITP Comments:  ITP Comments     Row Name 07/16/24 1026           ITP Comments Wilbert Bihari, MD: Medical Director. Introduction to the Pritikin Education Program/Intensive Cardiac Rehab. Initial orientation packet reviewed with the patient.          Comments: Participant attended orientation for the cardiac rehabilitation program on  07/16/2024  to perform initial intake and exercise walk test. Patient introduced to the Pritikin Program education and orientation packet was reviewed. Completed 6-minute walk test, measurements, initial ITP, and exercise prescription. Vital signs stable. Telemetry-normal sinus rhythm, with intermittent  PAC's and PVC's asymptomatic. ECG tracings were forwarded to the [facility] for review. Hadassah Elpidio Quan RN BSN   Service time was from 1020 to 1224.

## 2024-07-16 NOTE — Progress Notes (Addendum)
 Intermittent PAC's and PVC's noted. Underlying telemetry rhythm Sinus. Patient asymptomatic. Vital signs stable will send today's ECG tracings to the [facility] for review. Hadassah Elpidio Quan RN BSN

## 2024-07-22 ENCOUNTER — Encounter (HOSPITAL_COMMUNITY)

## 2024-07-24 ENCOUNTER — Encounter (HOSPITAL_COMMUNITY)
Admission: RE | Admit: 2024-07-24 | Discharge: 2024-07-24 | Disposition: A | Source: Ambulatory Visit | Attending: Cardiovascular Disease

## 2024-07-24 DIAGNOSIS — Z9889 Other specified postprocedural states: Secondary | ICD-10-CM | POA: Insufficient documentation

## 2024-07-24 NOTE — Progress Notes (Signed)
 Daily Session Note  Patient Details  Name: Beth Chapman MRN: [id number] Date of Birth: [birth year] Referring Provider:   Flowsheet Row INTENSIVE CARDIAC REHAB ORIENT from 07/16/2024 in [facility]  Referring Provider Beth Headland, MD    Encounter Date: 07/24/2024  Check In:  Session Check In - 07/24/24 1018       Check-In   Supervising physician immediately available to respond to emergencies [facility] MD immediately available    Physician(s) Beth Chapman. NP    Location [facility]    Staff Present Beth Quan, RN, Beth Gal, MS, ACSM-CEP, Exercise Physiologist;Beth Chapman, ACSM-CEP, Exercise Physiologist;Beth Lennon, RN, Beth Parkins, MS, ACSM-CEP, CCRP, Exercise Physiologist    Virtual Visit No    Medication changes reported     No    Fall or balance concerns reported    No    Tobacco Cessation No Change    Warm-up and Cool-down Performed as group-led instruction    Resistance Training Performed No    VAD Patient? No    PAD/SET Patient? No      Pain Assessment   Currently in Pain? No/denies    Pain Score 0-No pain    Multiple Pain Sites No          Capillary Blood Glucose: No results found for this or any previous visit (from the past 24 hours).   Exercise Prescription Changes - 07/24/24 1035       Response to Exercise   Blood Pressure (Admit) 102/68    Blood Pressure (Exercise) 124/66    Blood Pressure (Exit) 100/64    Heart Rate (Admit) 93 bpm    Heart Rate (Exercise) 110 bpm    Heart Rate (Exit) 102 bpm    Rating of Perceived Exertion (Exercise) 12    Symptoms None    Comments Off to a good start with exercise.    Duration Continue with 30 min of aerobic exercise without signs/symptoms of physical distress.    Intensity THRR unchanged      Progression   Progression Continue to progress workloads to maintain intensity without signs/symptoms of physical  distress.    Average METs 2.2      Resistance Training   Training Prescription No    Weight Relaxation day. No weights.      Interval Training   Interval Training No      Recumbant Bike   Level 1    RPM 53    Watts 14    Minutes 15    METs 2      NuStep   Level 1    SPM 78    Minutes 15    METs 2.5          Social History   Tobacco Use  Smoking Status Never  Smokeless Tobacco Never  Tobacco Comments   Never smoked 06/11/24    Goals Met:  Exercise tolerated well No report of concerns or symptoms today  Goals Unmet:  Not Applicable  Comments: Beth Chapman tolerated exercise well.  Telemetry was sinus rhythm with rare PVCs. She was asymptomatic throughout exercise.  Beth CHRISTELLA Gal, MS, ACSM CEP 07/24/2024 [phone]

## 2024-07-24 NOTE — Progress Notes (Signed)
 Cardiac Individual Treatment Plan  Patient Details  Name: Beth Chapman MRN: [id number] Date of Birth: 1951/06/18 Referring Provider:   Flowsheet Row INTENSIVE CARDIAC REHAB ORIENT from 07/16/2024 in [facility]  Referring Provider Croitoru, Jerel, MD    Initial Encounter Date:  Flowsheet Row INTENSIVE CARDIAC REHAB ORIENT from 07/16/2024 in [facility]  Date 07/16/24    Visit Diagnosis: S/P mitral valve repair at [facility], Dr Ricky  Patient's Home Medications on Admission:  Current Outpatient Medications:    acetaminophen  (TYLENOL ) 500 MG tablet, Take 1,000 mg by mouth every 6 (six) hours as needed for moderate pain (pain score 4-6)., Disp: , Rfl:    aspirin  81 MG chewable tablet, Chew 81 mg by mouth daily., Disp: , Rfl:    Cyanocobalamin (B-12 PO), Take 1 capsule by mouth daily., Disp: , Rfl:    cyclobenzaprine (FLEXERIL) 10 MG tablet, TAKE 1 TABLET BY MOUTH IN THE EVENING AS NEEDED, Disp: , Rfl:    fexofenadine-pseudoephedrine (ALLEGRA-D) 60-120 MG 12 hr tablet, Take 1 tablet by mouth daily as needed (allergies)., Disp: , Rfl:    MAGNESIUM GLYCINATE PO, Take 2 capsules by mouth daily., Disp: , Rfl:    meloxicam (MOBIC) 15 MG tablet, Take 15 mg by mouth daily as needed for pain., Disp: , Rfl:    metoprolol tartrate (LOPRESSOR) 25 MG tablet, Take 12.5 mg by mouth 2 (two) times daily., Disp: , Rfl:    Multiple Vitamin (MULTIVITAMIN) tablet, Take 1 tablet by mouth daily., Disp: , Rfl:    Omega-3 Fatty Acids (FISH OIL) 1200 MG CAPS, 1 capsule., Disp: , Rfl:    Turmeric 500 MG TABS, takes 2400 mg Orally (Patient not taking: Reported on 07/16/2024), Disp: , Rfl:    TURMERIC-GINGER PO, Take 2 capsules by mouth daily., Disp: , Rfl:    VITAMIN D-VITAMIN K PO, Take 2 capsules by mouth daily., Disp: , Rfl:   Current Facility-Administered Medications:    0.9 %  sodium chloride   infusion, 500 mL, Intravenous, Once, Abran Norleen SAILOR, MD  Past Medical History: Past Medical History:  Diagnosis Date   Allergy    Anxiety    Arthritis    Classic migraine 11/12/2013   Heart murmur    Migraines     Tobacco Use: Social History   Tobacco Use  Smoking Status Never  Smokeless Tobacco Never  Tobacco Comments   Never smoked 06/11/24    Labs: Review Flowsheet       Latest Ref Rng & Units 01/04/2024  Labs for ITP Cardiac and Pulmonary Rehab  PH, Arterial 7.35 - 7.45 7.407   PCO2 arterial 32 - 48 mmHg 35.8   Bicarbonate 20.0 - 28.0 mmol/L 22.6  24.8  23.7   TCO2 22 - 32 mmol/L 24  26  25    Acid-base deficit 0.0 - 2.0 mmol/L 2.0  1.0   O2 Saturation % 96  72  73     Details       Multiple values from one day are sorted in reverse-chronological order          Exercise Target Goals: Exercise Program Goal: Individual exercise prescription set using results from initial 6 min walk test and THRR while considering  patient's activity barriers and safety.   Exercise Prescription Goal: Initial exercise prescription builds to 30-45 minutes a day of aerobic activity, 2-3 days per week.  Home exercise guidelines will  be given to patient during program as part of exercise prescription that the participant will acknowledge.   Education: Aerobic Exercise: - Group verbal and visual presentation on the components of exercise prescription. Introduces F.I.T.T principle from ACSM for exercise prescriptions.  Reviews F.I.T.T. principles of aerobic exercise including progression. Written material provided at class time.   Education: Resistance Exercise: - Group verbal and visual presentation on the components of exercise prescription. Introduces F.I.T.T principle from ACSM for exercise prescriptions  Reviews F.I.T.T. principles of resistance exercise including progression. Written material provided at class time.    Education: Exercise & Equipment Safety: - Individual  verbal instruction and demonstration of equipment use and safety with use of the equipment.   Education: Exercise Physiology & General Exercise Guidelines: - Group verbal and written instruction with models to review the exercise physiology of the cardiovascular system and associated critical values. Provides general exercise guidelines with specific guidelines to those with heart or lung disease. Written material provided at class time.   Education: Flexibility, Balance, Mind/Body Relaxation: - Group verbal and visual presentation with interactive activity on the components of exercise prescription. Introduces F.I.T.T principle from ACSM for exercise prescriptions. Reviews F.I.T.T. principles of flexibility and balance exercise training including progression. Also discusses the mind body connection.  Reviews various relaxation techniques to help reduce and manage stress (i.e. Deep breathing, progressive muscle relaxation, and visualization). Balance handout provided to take home. Written material provided at class time.   Activity Barriers & Risk Stratification:  Activity Barriers & Cardiac Risk Stratification - 07/16/24 1155       Activity Barriers & Cardiac Risk Stratification   Activity Barriers Back Problems;Neck/Spine Problems;Shortness of Breath;Other (comment)    Comments Ruptured disc in neck and low back, left hip pain, OA.    Cardiac Risk Stratification Low          6 Minute Walk:  6 Minute Walk     Row Name 07/16/24 1210         6 Minute Walk   Phase Initial     Distance 1613 feet     Walk Time 6 minutes     # of Rest Breaks 0     MPH 3.05     METS 3.71     RPE 11     Perceived Dyspnea  1     VO2 Peak 12.99     Symptoms Yes (comment)     Comments Mild shortness of breath.     Resting HR 84 bpm     Resting BP 104/78     Resting Oxygen Saturation  97 %     Exercise Oxygen Saturation  during 6 min walk 100 %     Max Ex. HR 122 bpm     Max Ex. BP 132/62     2  Minute Post BP 128/78        Oxygen Initial Assessment:   Oxygen Re-Evaluation:   Oxygen Discharge (Final Oxygen Re-Evaluation):   Initial Exercise Prescription:  Initial Exercise Prescription - 07/16/24 1200       Date of Initial Exercise RX and Referring Provider   Date 07/16/24    Referring Provider Francyne Headland, MD    Expected Discharge Date 10/09/24      Recumbant Bike   Level 1    RPM 30    Watts 20    Minutes 15    METs 3      NuStep   Level 1    SPM 85  Minutes 15    METs 3      Prescription Details   Frequency (times per week) 3    Duration Progress to 30 minutes of continuous aerobic without signs/symptoms of physical distress      Intensity   THRR 40-80% of Max Heartrate 60-119    Ratings of Perceived Exertion 11-13    Perceived Dyspnea 0-4      Progression   Progression Continue to progress workloads to maintain intensity without signs/symptoms of physical distress.      Resistance Training   Training Prescription Yes    Weight 2 lbs    Reps 10-15          Perform Capillary Blood Glucose checks as needed.  Exercise Prescription Changes:   Exercise Prescription Changes     Row Name 07/24/24 1035             Response to Exercise   Blood Pressure (Admit) 102/68       Blood Pressure (Exercise) 124/66       Blood Pressure (Exit) 100/64       Heart Rate (Admit) 93 bpm       Heart Rate (Exercise) 110 bpm       Heart Rate (Exit) 102 bpm       Rating of Perceived Exertion (Exercise) 12       Symptoms None       Comments Off to a good start with exercise.       Duration Continue with 30 min of aerobic exercise without signs/symptoms of physical distress.       Intensity THRR unchanged         Progression   Progression Continue to progress workloads to maintain intensity without signs/symptoms of physical distress.       Average METs 2.2         Resistance Training   Training Prescription No       Weight Relaxation day. No  weights.         Interval Training   Interval Training No         Recumbant Bike   Level 1       RPM 53       Watts 14       Minutes 15       METs 2         NuStep   Level 1       SPM 78       Minutes 15       METs 2.5          Exercise Comments:   Exercise Comments     Row Name 07/24/24 1127           Exercise Comments [name] tolerated first session of exercise well without symptoms. Oriented her to the exercise equipment and stretching routine.          Exercise Goals and Review:   Exercise Goals     Row Name 07/16/24 1155             Exercise Goals   Increase Physical Activity Yes       Intervention Provide advice, education, support and counseling about physical activity/exercise needs.;Develop an individualized exercise prescription for aerobic and resistive training based on initial evaluation findings, risk stratification, comorbidities and participant's personal goals.       Expected Outcomes Short Term: Attend rehab on a regular basis to increase amount of physical activity.;Long Term: Add in home exercise to  make exercise part of routine and to increase amount of physical activity.;Long Term: Exercising regularly at least 3-5 days a week.       Increase Strength and Stamina Yes       Intervention Provide advice, education, support and counseling about physical activity/exercise needs.;Develop an individualized exercise prescription for aerobic and resistive training based on initial evaluation findings, risk stratification, comorbidities and participant's personal goals.       Expected Outcomes Short Term: Increase workloads from initial exercise prescription for resistance, speed, and METs.;Short Term: Perform resistance training exercises routinely during rehab and add in resistance training at home;Long Term: Improve cardiorespiratory fitness, muscular endurance and strength as measured by increased METs and functional capacity ( )       Able to  understand and use rate of perceived exertion (RPE) scale Yes       Intervention Provide education and explanation on how to use RPE scale       Expected Outcomes Short Term: Able to use RPE daily in rehab to express subjective intensity level;Long Term:  Able to use RPE to guide intensity level when exercising independently       Knowledge and understanding of Target Heart Rate Range (THRR) Yes       Intervention Provide education and explanation of THRR including how the numbers were predicted and where they are located for reference       Expected Outcomes Short Term: Able to state/look up THRR;Long Term: Able to use THRR to govern intensity when exercising independently;Short Term: Able to use daily as guideline for intensity in rehab       Able to check pulse independently Yes       Intervention Provide education and demonstration on how to check pulse in carotid and radial arteries.;Review the importance of being able to check your own pulse for safety during independent exercise       Expected Outcomes Short Term: Able to explain why pulse checking is important during independent exercise;Long Term: Able to check pulse independently and accurately       Understanding of Exercise Prescription Yes       Intervention Provide education, explanation, and written materials on patient's individual exercise prescription       Expected Outcomes Short Term: Able to explain program exercise prescription;Long Term: Able to explain home exercise prescription to exercise independently          Exercise Goals Re-Evaluation :  Exercise Goals Re-Evaluation     Row Name 07/24/24 1127             Exercise Goal Re-Evaluation   Exercise Goals Review Increase Physical Activity;Increase Strength and Stamina;Able to understand and use rate of perceived exertion (RPE) scale       Comments [name] is able to understand and use the RPE scale appropriately.       Expected Outcomes Progress workloads as tolerated  to help improve cardiorespiratory fitness.          Discharge Exercise Prescription (Final Exercise Prescription Changes):  Exercise Prescription Changes - 07/24/24 1035       Response to Exercise   Blood Pressure (Admit) 102/68    Blood Pressure (Exercise) 124/66    Blood Pressure (Exit) 100/64    Heart Rate (Admit) 93 bpm    Heart Rate (Exercise) 110 bpm    Heart Rate (Exit) 102 bpm    Rating of Perceived Exertion (Exercise) 12    Symptoms None    Comments Off to a good  start with exercise.    Duration Continue with 30 min of aerobic exercise without signs/symptoms of physical distress.    Intensity THRR unchanged      Progression   Progression Continue to progress workloads to maintain intensity without signs/symptoms of physical distress.    Average METs 2.2      Resistance Training   Training Prescription No    Weight Relaxation day. No weights.      Interval Training   Interval Training No      Recumbant Bike   Level 1    RPM 53    Watts 14    Minutes 15    METs 2      NuStep   Level 1    SPM 78    Minutes 15    METs 2.5          Nutrition:  Target Goals: Understanding of nutrition guidelines, daily intake of sodium 1500mg , cholesterol 200mg , calories 30% from fat and 7% or less from saturated fats, daily to have 5 or more servings of fruits and vegetables.  Education: Nutrition 1 -Group instruction provided by verbal, written material, interactive activities, discussions, models, and posters to present general guidelines for heart healthy nutrition including macronutrients, label reading, and promoting whole foods over processed counterparts. Education serves as pensions consultant of discussion of heart healthy eating for all. Written material provided at class time.    Education: Nutrition 2 -Group instruction provided by verbal, written material, interactive activities, discussions, models, and posters to present general guidelines for heart healthy  nutrition including sodium, cholesterol, and saturated fat. Providing guidance of habit forming to improve blood pressure, cholesterol, and body weight. Written material provided at class time.     Biometrics:  Pre Biometrics - 07/16/24 1257       Pre Biometrics   Height 5' 2.75 (1.594 m)    Weight 60.9 kg    Waist Circumference 31 inches    Hip Circumference 37.5 inches    Waist to Hip Ratio 0.83 %    BMI (Calculated) 23.97    Triceps Skinfold 22 mm    % Body Fat 34.8 %    Grip Strength 14 kg    Flexibility --   Not performed. Ruptured disc in neck and low back.   Single Leg Stand 30 seconds           Nutrition Therapy Plan and Nutrition Goals:  Nutrition Therapy & Goals - 07/24/24 1106       Nutrition Therapy   Diet Heart Healthy      Personal Nutrition Goals   Nutrition Goal Patient to identify strategies for reducing cardiovascular risk by attending the [facility] education and nutrition series weekly.    Comments Patient with history of s/p MVR on 05/13/24 at [facility], HLD, and atrial fibrillation. Endorses following heart healthy diet since husband's heart attack several years ago. Based on P[employer] survey, may further improve diet by increasing intake of plant-based foods. Patient will benefit from participation in intensive cardiac rehab for nutrition education, exercise, and lifestyle modification.      Intervention Plan   Intervention Prescribe, educate and counsel regarding individualized specific dietary modifications aiming towards targeted core components such as weight, hypertension, lipid management, diabetes, heart failure and other comorbidities.    Expected Outcomes Short Term Goal: Understand basic principles of dietary content, such as calories, fat, sodium, cholesterol and nutrients.;Long Term Goal: Adherence to prescribed nutrition plan.          Nutrition  Assessments:  MEDIFICTS Score Key: >=70 Need to make dietary changes  40-70 Heart Healthy Diet <=  40 Therapeutic Level Cholesterol Diet  Flowsheet Row INTENSIVE CARDIAC REHAB from 07/24/2024 in [facility]  Picture Your Plate Total Score on Admission 74   Picture Your Plate Scores: <59 Unhealthy dietary pattern with much room for improvement. 41-50 Dietary pattern unlikely to meet recommendations for good health and room for improvement. 51-60 More healthful dietary pattern, with some room for improvement.  >60 Healthy dietary pattern, although there may be some specific behaviors that could be improved.    Nutrition Goals Re-Evaluation:   Nutrition Goals Discharge (Final Nutrition Goals Re-Evaluation):   Psychosocial: Target Goals: Acknowledge presence or absence of significant depression and/or stress, maximize coping skills, provide positive support system. Participant is able to verbalize types and ability to use techniques and skills needed for reducing stress and depression.   Education: Stress, Anxiety, and Depression - Group verbal and visual presentation to define topics covered.  Reviews how body is impacted by stress, anxiety, and depression.  Also discusses healthy ways to reduce stress and to treat/manage anxiety and depression. Written material provided at class time.   Education: Sleep Hygiene -Provides group verbal and written instruction about how sleep can affect your health.  Define sleep hygiene, discuss sleep cycles and impact of sleep habits. Review good sleep hygiene tips.   Initial Review & Psychosocial Screening:  Initial Psych Review & Screening - 07/16/24 1108       Initial Review   Current issues with None Identified      Family Dynamics   Good Support System? Yes   [name] lives with her husband who she has for support. [name] also has friends that she can rely on. [name]'s 45 year old mother lives out of town     Barriers   Psychosocial barriers to participate in program The patient should  benefit from training in stress management and relaxation.      Screening Interventions   Interventions Encouraged to exercise    Expected Outcomes Long Term goal: The participant improves quality of Life and PHQ9 Scores as seen by post scores and/or verbalization of changes;Short Term goal: Identification and review with participant of any Quality of Life or Depression concerns found by scoring the questionnaire.          Quality of Life Scores:   Quality of Life - 07/16/24 1416       Quality of Life   Select Quality of Life      Quality of Life Scores   Health/Function Pre 22.4 %    Socioeconomic Pre 26.57 %    Psych/Spiritual Pre 24.86 %    Family Pre 25.5 %    GLOBAL Pre 24.18 %         Scores of 19 and below usually indicate a poorer quality of life in these areas.  A difference of  2-3 points is a clinically meaningful difference.  A difference of 2-3 points in the total score of the Quality of Life Index has been associated with significant improvement in overall quality of life, self-image, physical symptoms, and general health in studies assessing change in quality of life.  PHQ-9: Review Flowsheet       07/16/2024  Depression screen PHQ 2/9  Decreased Interest 0  Down, Depressed, Hopeless 0  PHQ - 2 Score 0  Altered sleeping 1  Tired, decreased energy 1  Change  in appetite 0  Feeling bad or failure about yourself  0  Trouble concentrating 0  Moving slowly or fidgety/restless 0  Suicidal thoughts 0  PHQ-9 Score 2  Difficult doing work/chores Not difficult at all   Interpretation of Total Score  Total Score Depression Severity:  1-4 = Minimal depression, 5-9 = Mild depression, 10-14 = Moderate depression, 15-19 = Moderately severe depression, 20-27 = Severe depression   Psychosocial Evaluation and Intervention:   Psychosocial Re-Evaluation:   Psychosocial Discharge (Final Psychosocial Re-Evaluation):   Vocational Rehabilitation: Provide vocational  rehab assistance to qualifying candidates.   Vocational Rehab Evaluation & Intervention:  Vocational Rehab - 07/16/24 1110       Initial Vocational Rehab Evaluation & Intervention   Assessment shows need for Vocational Rehabilitation No   [name] is retired and does not need vocational rehab at this time         Education: Education Goals: Education classes will be provided on a variety of topics geared toward better understanding of heart health and risk factor modification. Participant will state understanding/return demonstration of topics presented as noted by education test scores.  Learning Barriers/Preferences:  Learning Barriers/Preferences - 07/16/24 1110       Learning Barriers/Preferences   Learning Barriers Exercise Concerns   neck problems   Learning Preferences Video;Pictoral          General Cardiac Education Topics:  AED/CPR: - Group verbal and written instruction with the use of models to demonstrate the basic use of the AED with the basic ABC's of resuscitation.   Test and Procedures: - Group verbal and visual presentation and models provide information about basic cardiac anatomy and function. Reviews the testing methods done to diagnose heart disease and the outcomes of the test results. Describes the treatment choices: Medical Management, Angioplasty, or Coronary Bypass Surgery for treating various heart conditions including Myocardial Infarction, Angina, Valve Disease, and Cardiac Arrhythmias. Written material provided at class time.   Medication Safety: - Group verbal and visual instruction to review commonly prescribed medications for heart and lung disease. Reviews the medication, class of the drug, and side effects. Includes the steps to properly store meds and maintain the prescription regimen. Written material provided at class time.   Intimacy: - Group verbal instruction through game format to discuss how heart and lung disease can affect sexual  intimacy. Written material provided at class time.   Know Your Numbers and Heart Failure: - Group verbal and visual instruction to discuss disease risk factors for cardiac and pulmonary disease and treatment options.  Reviews associated critical values for Overweight/Obesity, Hypertension, Cholesterol, and Diabetes.  Discusses basics of heart failure: signs/symptoms and treatments.  Introduces Heart Failure Zone chart for action plan for heart failure. Written material provided at class time.   Infection Prevention: - Provides verbal and written material to individual with discussion of infection control including proper hand washing and proper equipment cleaning during exercise session.   Falls Prevention: - Provides verbal and written material to individual with discussion of falls prevention and safety.   Other: -Provides group and verbal instruction on various topics (see comments)   Knowledge Questionnaire Score:  Knowledge Questionnaire Score - 07/16/24 1417       Knowledge Questionnaire Score   Pre Score 24/24          Core Components/Risk Factors/Patient Goals at Admission:  Personal Goals and Risk Factors at Admission - 07/16/24 1155       Core Components/Risk Factors/Patient Goals on Admission  Lipids Yes    Intervention Provide education and support for participant on nutrition & aerobic/resistive exercise along with prescribed medications to achieve LDL 70mg , HDL >40mg .    Expected Outcomes Short Term: Participant states understanding of desired cholesterol values and is compliant with medications prescribed. Participant is following exercise prescription and nutrition guidelines.;Long Term: Cholesterol controlled with medications as prescribed, with individualized exercise RX and with personalized nutrition plan. Value goals: LDL < 70mg , HDL > 40 mg.          Education:Diabetes - Individual verbal and written instruction to review signs/symptoms of diabetes,  desired ranges of glucose level fasting, after meals and with exercise. Acknowledge that pre and post exercise glucose checks will be done for 3 sessions at entry of program.   Core Components/Risk Factors/Patient Goals Review:    Core Components/Risk Factors/Patient Goals at Discharge (Final Review):    ITP Comments:  ITP Comments     Row Name 07/16/24 1026 07/24/24 1127         ITP Comments Wilbert Bihari, MD: Medical Director. Introduction to the [facility] Education Program/Intensive Cardiac Rehab. Initial orientation packet reviewed with the patient. 30-day ITP review. [name] started the cardiac rehab program today. She did well on the recumbent bike and recumbent stepper. No symptoms noted. She also attended the cooking school education class today.         Comments: See ITP Comments

## 2024-07-26 ENCOUNTER — Encounter (HOSPITAL_COMMUNITY)
Admission: RE | Admit: 2024-07-26 | Discharge: 2024-07-26 | Disposition: A | Source: Ambulatory Visit | Attending: Cardiovascular Disease

## 2024-07-26 DIAGNOSIS — Z9889 Other specified postprocedural states: Secondary | ICD-10-CM | POA: Diagnosis not present

## 2024-07-29 ENCOUNTER — Encounter (HOSPITAL_COMMUNITY)
Admission: RE | Admit: 2024-07-29 | Discharge: 2024-07-29 | Disposition: A | Source: Ambulatory Visit | Attending: Cardiovascular Disease

## 2024-07-29 DIAGNOSIS — Z9889 Other specified postprocedural states: Secondary | ICD-10-CM

## 2024-07-31 ENCOUNTER — Encounter (HOSPITAL_COMMUNITY)
Admission: RE | Admit: 2024-07-31 | Discharge: 2024-07-31 | Disposition: A | Discharge status: Home / Self Care | Source: Ambulatory Visit | Attending: Cardiovascular Disease | Admitting: Cardiovascular Disease

## 2024-07-31 DIAGNOSIS — Z9889 Other specified postprocedural states: Secondary | ICD-10-CM | POA: Diagnosis not present

## 2024-08-02 ENCOUNTER — Encounter (HOSPITAL_COMMUNITY)
Admission: RE | Admit: 2024-08-02 | Discharge: 2024-08-02 | Disposition: A | Source: Ambulatory Visit | Attending: Cardiovascular Disease

## 2024-08-02 DIAGNOSIS — Z9889 Other specified postprocedural states: Secondary | ICD-10-CM | POA: Diagnosis not present

## 2024-08-05 ENCOUNTER — Encounter (HOSPITAL_COMMUNITY): Admission: RE | Admit: 2024-08-05 | Source: Ambulatory Visit

## 2024-08-05 ENCOUNTER — Encounter (HOSPITAL_COMMUNITY)
Admission: RE | Admit: 2024-08-05 | Discharge: 2024-08-05 | Disposition: A | Source: Ambulatory Visit | Attending: Cardiovascular Disease

## 2024-08-05 DIAGNOSIS — Z9889 Other specified postprocedural states: Secondary | ICD-10-CM

## 2024-08-07 ENCOUNTER — Encounter (HOSPITAL_COMMUNITY): Admission: RE | Admit: 2024-08-07 | Discharge: 2024-08-07 | Attending: Cardiovascular Disease

## 2024-08-07 DIAGNOSIS — Z9889 Other specified postprocedural states: Secondary | ICD-10-CM

## 2024-08-09 ENCOUNTER — Encounter (HOSPITAL_COMMUNITY): Admission: RE | Admit: 2024-08-09 | Discharge: 2024-08-09 | Attending: Cardiovascular Disease

## 2024-08-09 DIAGNOSIS — Z9889 Other specified postprocedural states: Secondary | ICD-10-CM

## 2024-08-12 ENCOUNTER — Encounter (HOSPITAL_COMMUNITY)

## 2024-08-12 DIAGNOSIS — Z9889 Other specified postprocedural states: Secondary | ICD-10-CM | POA: Diagnosis not present

## 2024-08-14 ENCOUNTER — Encounter (HOSPITAL_COMMUNITY)

## 2024-08-16 ENCOUNTER — Encounter (HOSPITAL_COMMUNITY)

## 2024-08-19 ENCOUNTER — Encounter (HOSPITAL_COMMUNITY)

## 2024-08-20 NOTE — Progress Notes (Signed)
 Cardiac Individual Treatment Plan  Patient Details  Name: Beth Chapman MRN: [id number] Date of Birth: 1950/11/15 Referring Provider:   Flowsheet Row INTENSIVE CARDIAC REHAB ORIENT from 07/16/2024 in [facility]  Referring Provider Croitoru, Jerel, MD    Initial Encounter Date:  Flowsheet Row INTENSIVE CARDIAC REHAB ORIENT from 07/16/2024 in [facility]  Date 07/16/24    Visit Diagnosis: S/P mitral valve repair at [facility], Dr Ricky  Patient's Home Medications on Admission: Current Medications[1]  Past Medical History: Past Medical History:  Diagnosis Date   Allergy    Anxiety    Arthritis    Classic migraine 11/12/2013   Heart murmur    Migraines     Tobacco Use: Tobacco Use History[2]  Labs: Review Flowsheet       Latest Ref Rng & Units 01/04/2024  Labs for ITP Cardiac and Pulmonary Rehab  PH, Arterial 7.35 - 7.45 7.407   PCO2 arterial 32 - 48 mmHg 35.8   Bicarbonate 20.0 - 28.0 mmol/L 22.6  24.8  23.7   TCO2 22 - 32 mmol/L 24  26  25    Acid-base deficit 0.0 - 2.0 mmol/L 2.0  1.0   O2 Saturation % 96  72  73     Details       Multiple values from one day are sorted in reverse-chronological order         Capillary Blood Glucose: No results found for: GLUCAP   Exercise Target Goals: Exercise Program Goal: Individual exercise prescription set using results from initial 6 min walk test and THRR while considering  patients activity barriers and safety.   Exercise Prescription Goal: Initial exercise prescription builds to 30-45 minutes a day of aerobic activity, 2-3 days per week.  Home exercise guidelines will be given to patient during program as part of exercise prescription that the participant will acknowledge.  Activity Barriers & Risk Stratification:  Activity Barriers & Cardiac Risk Stratification - 07/16/24 1155       Activity  Barriers & Cardiac Risk Stratification   Activity Barriers Back Problems;Neck/Spine Problems;Shortness of Breath;Other (comment)    Comments Ruptured disc in neck and low back, left hip pain, OA.    Cardiac Risk Stratification Low          6 Minute Walk:  6 Minute Walk     Row Name 07/16/24 1210         6 Minute Walk   Phase Initial     Distance 1613 feet     Walk Time 6 minutes     # of Rest Breaks 0     MPH 3.05     METS 3.71     RPE 11     Perceived Dyspnea  1     VO2 Peak 12.99     Symptoms Yes (comment)     Comments Mild shortness of breath.     Resting HR 84 bpm     Resting BP 104/78     Resting Oxygen Saturation  97 %     Exercise Oxygen Saturation  during 6 min walk 100 %     Max Ex. HR 122 bpm     Max Ex. BP 132/62     2 Minute Post BP 128/78        Oxygen Initial Assessment:   Oxygen Re-Evaluation:   Oxygen Discharge (Final Oxygen Re-Evaluation):   Initial Exercise  Prescription:  Initial Exercise Prescription - 07/16/24 1200       Date of Initial Exercise RX and Referring Provider   Date 07/16/24    Referring Provider Croitoru, Jerel, MD    Expected Discharge Date 10/09/24      Recumbant Bike   Level 1    RPM 30    Watts 20    Minutes 15    METs 3      NuStep   Level 1    SPM 85    Minutes 15    METs 3      Prescription Details   Frequency (times per week) 3    Duration Progress to 30 minutes of continuous aerobic without signs/symptoms of physical distress      Intensity   THRR 40-80% of Max Heartrate 60-119    Ratings of Perceived Exertion 11-13    Perceived Dyspnea 0-4      Progression   Progression Continue to progress workloads to maintain intensity without signs/symptoms of physical distress.      Resistance Training   Training Prescription Yes    Weight 2 lbs    Reps 10-15          Perform Capillary Blood Glucose checks as needed.  Exercise Prescription Changes:   Exercise Prescription Changes     Row  Name 07/24/24 1035 08/05/24 1027 08/12/24 1027         Response to Exercise   Blood Pressure (Admit) 102/68 124/68 118/68     Blood Pressure (Exercise) 124/66 142/86 122/72     Blood Pressure (Exit) 100/64 126/84 102/66     Heart Rate (Admit) 93 bpm 78 bpm 83 bpm     Heart Rate (Exercise) 110 bpm 115 bpm 113 bpm     Heart Rate (Exit) 102 bpm 87 bpm 92 bpm     Rating of Perceived Exertion (Exercise) 12 12 12      Symptoms None None None     Comments Off to a good start with exercise. Off to a good start with exercise. Increased workload on recumbent stepper.     Duration Continue with 30 min of aerobic exercise without signs/symptoms of physical distress. Continue with 30 min of aerobic exercise without signs/symptoms of physical distress. Continue with 30 min of aerobic exercise without signs/symptoms of physical distress.     Intensity THRR unchanged THRR unchanged THRR unchanged       Progression   Progression Continue to progress workloads to maintain intensity without signs/symptoms of physical distress. Continue to progress workloads to maintain intensity without signs/symptoms of physical distress. Continue to progress workloads to maintain intensity without signs/symptoms of physical distress.     Average METs 2.2 2.7 3.4       Resistance Training   Training Prescription No -- Yes     Weight Relaxation day. No weights. 2 lbs 2 lbs     Reps -- 10-15 10-15     Time -- 5 Minutes 5 Minutes       Interval Training   Interval Training No No No       Recumbant Bike   Level 1 1 2      RPM 53 73 81     Watts 14 18 23      Minutes 15 15 15      METs 2 2.3 3.3       NuStep   Level 1 1 2      SPM 78 98 106     Minutes 15  15 15     METs 2.5 3.2 3.6        Exercise Comments:   Exercise Comments     Row Name 07/24/24 1127           Exercise Comments Beth Chapman tolerated first session of exercise well without symptoms. Oriented her to the exercise equipment and stretching routine.           Exercise Goals and Review:   Exercise Goals     Row Name 07/16/24 1155             Exercise Goals   Increase Physical Activity Yes       Intervention Provide advice, education, support and counseling about physical activity/exercise needs.;Develop an individualized exercise prescription for aerobic and resistive training based on initial evaluation findings, risk stratification, comorbidities and participant's personal goals.       Expected Outcomes Short Term: Attend rehab on a regular basis to increase amount of physical activity.;Long Term: Add in home exercise to make exercise part of routine and to increase amount of physical activity.;Long Term: Exercising regularly at least 3-5 days a week.       Increase Strength and Stamina Yes       Intervention Provide advice, education, support and counseling about physical activity/exercise needs.;Develop an individualized exercise prescription for aerobic and resistive training based on initial evaluation findings, risk stratification, comorbidities and participant's personal goals.       Expected Outcomes Short Term: Increase workloads from initial exercise prescription for resistance, speed, and METs.;Short Term: Perform resistance training exercises routinely during rehab and add in resistance training at home;Long Term: Improve cardiorespiratory fitness, muscular endurance and strength as measured by increased METs and functional capacity ( )       Able to understand and use rate of perceived exertion (RPE) scale Yes       Intervention Provide education and explanation on how to use RPE scale       Expected Outcomes Short Term: Able to use RPE daily in rehab to express subjective intensity level;Long Term:  Able to use RPE to guide intensity level when exercising independently       Knowledge and understanding of Target Heart Rate Range (THRR) Yes       Intervention Provide education and explanation of THRR including how the  numbers were predicted and where they are located for reference       Expected Outcomes Short Term: Able to state/look up THRR;Long Term: Able to use THRR to govern intensity when exercising independently;Short Term: Able to use daily as guideline for intensity in rehab       Able to check pulse independently Yes       Intervention Provide education and demonstration on how to check pulse in carotid and radial arteries.;Review the importance of being able to check your own pulse for safety during independent exercise       Expected Outcomes Short Term: Able to explain why pulse checking is important during independent exercise;Long Term: Able to check pulse independently and accurately       Understanding of Exercise Prescription Yes       Intervention Provide education, explanation, and written materials on patient's individual exercise prescription       Expected Outcomes Short Term: Able to explain program exercise prescription;Long Term: Able to explain home exercise prescription to exercise independently          Exercise Goals Re-Evaluation :  Exercise Goals Re-Evaluation     Row  Name 07/24/24 1127 08/12/24 1133           Exercise Goal Re-Evaluation   Exercise Goals Review Increase Physical Activity;Increase Strength and Stamina;Able to understand and use rate of perceived exertion (RPE) scale Increase Physical Activity;Increase Strength and Stamina;Able to understand and use rate of perceived exertion (RPE) scale      Comments Beth Chapman is able to understand and use the RPE scale appropriately. Beth Chapman is making good progress with exercise.      Expected Outcomes Progress workloads as tolerated to help improve cardiorespiratory fitness. Continue to increase workloads as tolerated.         Discharge Exercise Prescription (Final Exercise Prescription Changes):  Exercise Prescription Changes - 08/12/24 1027       Response to Exercise   Blood Pressure (Admit) 118/68    Blood Pressure  (Exercise) 122/72    Blood Pressure (Exit) 102/66    Heart Rate (Admit) 83 bpm    Heart Rate (Exercise) 113 bpm    Heart Rate (Exit) 92 bpm    Rating of Perceived Exertion (Exercise) 12    Symptoms None    Comments Increased workload on recumbent stepper.    Duration Continue with 30 min of aerobic exercise without signs/symptoms of physical distress.    Intensity THRR unchanged      Progression   Progression Continue to progress workloads to maintain intensity without signs/symptoms of physical distress.    Average METs 3.4      Resistance Training   Training Prescription Yes    Weight 2 lbs    Reps 10-15    Time 5 Minutes      Interval Training   Interval Training No      Recumbant Bike   Level 2    RPM 81    Watts 23    Minutes 15    METs 3.3      NuStep   Level 2    SPM 106    Minutes 15    METs 3.6          Nutrition:  Target Goals: Understanding of nutrition guidelines, daily intake of sodium 1500mg , cholesterol 200mg , calories 30% from fat and 7% or less from saturated fats, daily to have 5 or more servings of fruits and vegetables.  Biometrics:  Pre Biometrics - 07/16/24 1257       Pre Biometrics   Height 5' 2.75 (1.594 m)    Weight 60.9 kg    Waist Circumference 31 inches    Hip Circumference 37.5 inches    Waist to Hip Ratio 0.83 %    BMI (Calculated) 23.97    Triceps Skinfold 22 mm    % Body Fat 34.8 %    Grip Strength 14 kg    Flexibility --   Not performed. Ruptured disc in neck and low back.   Single Leg Stand 30 seconds           Nutrition Therapy Plan and Nutrition Goals:  Nutrition Therapy & Goals - 07/24/24 1106       Nutrition Therapy   Diet Heart Healthy      Personal Nutrition Goals   Nutrition Goal Patient to identify strategies for reducing cardiovascular risk by attending the Pritikin education and nutrition series weekly.    Comments Patient with history of s/p MVR on 05/13/24 at [facility], HLD, and atrial fibrillation.  Endorses following heart healthy diet since husband's heart attack several years ago. Based on PYP survey, may further improve diet  by increasing intake of plant-based foods. Patient will benefit from participation in intensive cardiac rehab for nutrition education, exercise, and lifestyle modification.      Intervention Plan   Intervention Prescribe, educate and counsel regarding individualized specific dietary modifications aiming towards targeted core components such as weight, hypertension, lipid management, diabetes, heart failure and other comorbidities.    Expected Outcomes Short Term Goal: Understand basic principles of dietary content, such as calories, fat, sodium, cholesterol and nutrients.;Long Term Goal: Adherence to prescribed nutrition plan.          Nutrition Assessments:  MEDIFICTS Score Key: >=70 Need to make dietary changes  40-70 Heart Healthy Diet <= 40 Therapeutic Level Cholesterol Diet   Flowsheet Row INTENSIVE CARDIAC REHAB from 07/24/2024 in [facility]  Picture Your Plate Total Score on Admission 74   Picture Your Plate Scores: <59 Unhealthy dietary pattern with much room for improvement. 41-50 Dietary pattern unlikely to meet recommendations for good health and room for improvement. 51-60 More healthful dietary pattern, with some room for improvement.  >60 Healthy dietary pattern, although there may be some specific behaviors that could be improved.    Nutrition Goals Re-Evaluation:   Nutrition Goals Re-Evaluation:   Nutrition Goals Discharge (Final Nutrition Goals Re-Evaluation):   Psychosocial: Target Goals: Acknowledge presence or absence of significant depression and/or stress, maximize coping skills, provide positive support system. Participant is able to verbalize types and ability to use techniques and skills needed for reducing stress and depression.  Initial Review & Psychosocial  Screening:  Initial Psych Review & Screening - 07/16/24 1108       Initial Review   Current issues with None Identified      Family Dynamics   Good Support System? Yes   Beth Chapman lives with her husband who she has for support. Beth Chapman also has friends that she can rely on. Beth Chapman's 90 year old mother lives out of town     Barriers   Psychosocial barriers to participate in program The patient should benefit from training in stress management and relaxation.      Screening Interventions   Interventions Encouraged to exercise    Expected Outcomes Long Term goal: The participant improves quality of Life and PHQ9 Scores as seen by post scores and/or verbalization of changes;Short Term goal: Identification and review with participant of any Quality of Life or Depression concerns found by scoring the questionnaire.          Quality of Life Scores:  Quality of Life - 07/16/24 1416       Quality of Life   Select Quality of Life      Quality of Life Scores   Health/Function Pre 22.4 %    Socioeconomic Pre 26.57 %    Psych/Spiritual Pre 24.86 %    Family Pre 25.5 %    GLOBAL Pre 24.18 %         Scores of 19 and below usually indicate a poorer quality of life in these areas.  A difference of  2-3 points is a clinically meaningful difference.  A difference of 2-3 points in the total score of the Quality of Life Index has been associated with significant improvement in overall quality of life, self-image, physical symptoms, and general health in studies assessing change in quality of life.  PHQ-9: Review Flowsheet       07/16/2024  Depression screen PHQ 2/9  Decreased Interest 0  Down, Depressed, Hopeless 0  PHQ - 2 Score 0  Altered sleeping 1  Tired, decreased energy 1  Change in appetite 0  Feeling bad or failure about yourself  0  Trouble concentrating 0  Moving slowly or fidgety/restless 0  Suicidal thoughts 0  PHQ-9 Score 2  Difficult doing work/chores Not difficult at all    Interpretation of Total Score  Total Score Depression Severity:  1-4 = Minimal depression, 5-9 = Mild depression, 10-14 = Moderate depression, 15-19 = Moderately severe depression, 20-27 = Severe depression   Psychosocial Evaluation and Intervention:   Psychosocial Re-Evaluation:  Psychosocial Re-Evaluation     Row Name 08/20/24 1511 08/20/24 1512           Psychosocial Re-Evaluation   Current issues with None Identified Current Sleep Concerns      Comments -- Reviewed PHQ9 at orientation. Beth Chapman says she has difficulty sleeping at times discussed sleep hygiene.      Interventions Encouraged to attend Cardiac Rehabilitation for the exercise Encouraged to attend Cardiac Rehabilitation for the exercise      Continue Psychosocial Services  No Follow up required No Follow up required         Psychosocial Discharge (Final Psychosocial Re-Evaluation):  Psychosocial Re-Evaluation - 08/20/24 1512       Psychosocial Re-Evaluation   Current issues with Current Sleep Concerns    Comments Reviewed PHQ9 at orientation. Beth Chapman says she has difficulty sleeping at times discussed sleep hygiene.    Interventions Encouraged to attend Cardiac Rehabilitation for the exercise    Continue Psychosocial Services  No Follow up required          Vocational Rehabilitation: Provide vocational rehab assistance to qualifying candidates.   Vocational Rehab Evaluation & Intervention:  Vocational Rehab - 07/16/24 1110       Initial Vocational Rehab Evaluation & Intervention   Assessment shows need for Vocational Rehabilitation No   Beth Chapman is retired and does not need vocational rehab at this time         Education: Education Goals: Education classes will be provided on a weekly basis, covering required topics. Participant will state understanding/return demonstration of topics presented.    Education     Row Name 07/24/24 1000     Education   Cardiac Education Topics Pritikin   Loss Adjuster, Chartered   Weekly Topic Comforting Weekend Breakfasts   Instruction Review Code 1- Verbalizes Understanding   Class Start Time 1145   Class Stop Time 1224   Class Time Calculation (min) 39 min    Row Name 07/26/24 1000     Education   Cardiac Education Topics Pritikin   Licensed Conveyancer Nutrition   Nutrition Dining Out - Part 1   Instruction Review Code 1- Verbalizes Understanding   Class Start Time 1150   Class Stop Time 1227   Class Time Calculation (min) 37 min    Row Name 07/31/24 1100     Education   Cardiac Education Topics Pritikin   Secondary School Teacher School   Educator Dietitian   Weekly Topic Fast Evening Meals   Instruction Review Code 1- Verbalizes Understanding   Class Start Time 1148   Class Stop Time 1232   Class Time Calculation (min) 44 min    Row Name 08/05/24 1100     Education   Cardiac Education Topics  Pritikin   Psychologist, Forensic Exercise Education   Exercise Education Improving Performance   Instruction Review Code 1- Verbalizes Understanding   Class Start Time 1150   Class Stop Time 1225   Class Time Calculation (min) 35 min    Row Name 08/05/24 1100     Education   Cardiac Education Topics Pritikin   Psychologist, Forensic Exercise Education   Exercise Education Improving Performance   Instruction Review Code 1- Verbalizes Understanding    Row Name 08/09/24 1300     Education   Cardiac Education Topics Pritikin   Glass Blower/designer Nutrition   Nutrition Workshop Fueling a Forensic Psychologist   Instruction Review Code 1- Tefl Teacher Understanding   Class Start Time 1150   Class Stop Time 1229   Class Time Calculation (min) 39 min    Row Name 08/12/24 1000     Education    Cardiac Education Topics Pritikin   Geographical Information Systems Officer Psychosocial   Psychosocial Workshop Healthy Sleep for a Healthy Heart   Instruction Review Code 1- Verbalizes Understanding   Class Start Time 1150   Class Stop Time 1237   Class Time Calculation (min) 47 min      Core Videos: Exercise    Move It!  Clinical staff conducted group or individual video education with verbal and written material and guidebook.  Patient learns the recommended Pritikin exercise program. Exercise with the goal of living a long, healthy life. Some of the health benefits of exercise include controlled diabetes, healthier blood pressure levels, improved cholesterol levels, improved heart and lung capacity, improved sleep, and better body composition. Everyone should speak with their doctor before starting or changing an exercise routine.  Biomechanical Limitations Clinical staff conducted group or individual video education with verbal and written material and guidebook.  Patient learns how biomechanical limitations can impact exercise and how we can mitigate and possibly overcome limitations to have an impactful and balanced exercise routine.  Body Composition Clinical staff conducted group or individual video education with verbal and written material and guidebook.  Patient learns that body composition (ratio of muscle mass to fat mass) is a key component to assessing overall fitness, rather than body weight alone. Increased fat mass, especially visceral belly fat, can put us  at increased risk for metabolic syndrome, type 2 diabetes, heart disease, and even death. It is recommended to combine diet and exercise (cardiovascular and resistance training) to improve your body composition. Seek guidance from your physician and exercise physiologist before implementing an exercise routine.  Exercise Action Plan Clinical staff conducted group or individual  video education with verbal and written material and guidebook.  Patient learns the recommended strategies to achieve and enjoy long-term exercise adherence, including variety, self-motivation, self-efficacy, and positive decision making. Benefits of exercise include fitness, good health, weight management, more energy, better sleep, less stress, and overall well-being.  Medical   Heart Disease Risk Reduction Clinical staff conducted group or individual video education with verbal and written material and guidebook.  Patient learns our heart is our most vital organ as it circulates oxygen, nutrients, white blood cells, and hormones throughout the entire body, and carries waste away. Data supports a plant-based eating  plan like the Pritikin Program for its effectiveness in slowing progression of and reversing heart disease. The video provides a number of recommendations to address heart disease.   Metabolic Syndrome and Belly Fat  Clinical staff conducted group or individual video education with verbal and written material and guidebook.  Patient learns what metabolic syndrome is, how it leads to heart disease, and how one can reverse it and keep it from coming back. You have metabolic syndrome if you have 3 of the following 5 criteria: abdominal obesity, high blood pressure, high triglycerides, low HDL cholesterol, and high blood sugar.  Hypertension and Heart Disease Clinical staff conducted group or individual video education with verbal and written material and guidebook.  Patient learns that high blood pressure, or hypertension, is very common in the [place] . Hypertension is largely due to excessive salt intake, but other important risk factors include being overweight, physical inactivity, drinking too much alcohol, smoking, and not eating enough potassium from fruits and vegetables. High blood pressure is a leading risk factor for heart attack, stroke, congestive heart failure, dementia,  kidney failure, and premature death. Long-term effects of excessive salt intake include stiffening of the arteries and thickening of heart muscle and organ damage. Recommendations include ways to reduce hypertension and the risk of heart disease.  Diseases of Our Time - Focusing on Diabetes Clinical staff conducted group or individual video education with verbal and written material and guidebook.  Patient learns why the best way to stop diseases of our time is prevention, through food and other lifestyle changes. Medicine (such as prescription pills and surgeries) is often only a Band-Aid on the problem, not a long-term solution. Most common diseases of our time include obesity, type 2 diabetes, hypertension, heart disease, and cancer. The Pritikin Program is recommended and has been proven to help reduce, reverse, and/or prevent the damaging effects of metabolic syndrome.  Nutrition   Overview of the Pritikin Eating Plan  Clinical staff conducted group or individual video education with verbal and written material and guidebook.  Patient learns about the Pritikin Eating Plan for disease risk reduction. The Pritikin Eating Plan emphasizes a wide variety of unrefined, minimally-processed carbohydrates, like fruits, vegetables, whole grains, and legumes. Go, Caution, and Stop food choices are explained. Plant-based and lean animal proteins are emphasized. Rationale provided for low sodium intake for blood pressure control, low added sugars for blood sugar stabilization, and low added fats and oils for coronary artery disease risk reduction and weight management.  Calorie Density  Clinical staff conducted group or individual video education with verbal and written material and guidebook.  Patient learns about calorie density and how it impacts the Pritikin Eating Plan. Knowing the characteristics of the food you choose will help you decide whether those foods will lead to weight gain or weight loss, and  whether you want to consume more or less of them. Weight loss is usually a side effect of the Pritikin Eating Plan because of its focus on low calorie-dense foods.  Label Reading  Clinical staff conducted group or individual video education with verbal and written material and guidebook.  Patient learns about the Pritikin recommended label reading guidelines and corresponding recommendations regarding calorie density, added sugars, sodium content, and whole grains.  Dining Out - Part 1  Clinical staff conducted group or individual video education with verbal and written material and guidebook.  Patient learns that restaurant meals can be sabotaging because they can be so high in calories, fat, sodium, and/or  sugar. Patient learns recommended strategies on how to positively address this and avoid unhealthy pitfalls.  Facts on Fats  Clinical staff conducted group or individual video education with verbal and written material and guidebook.  Patient learns that lifestyle modifications can be just as effective, if not more so, as many medications for lowering your risk of heart disease. A Pritikin lifestyle can help to reduce your risk of inflammation and atherosclerosis (cholesterol build-up, or plaque, in the artery walls). Lifestyle interventions such as dietary choices and physical activity address the cause of atherosclerosis. A review of the types of fats and their impact on blood cholesterol levels, along with dietary recommendations to reduce fat intake is also included.  Nutrition Action Plan  Clinical staff conducted group or individual video education with verbal and written material and guidebook.  Patient learns how to incorporate Pritikin recommendations into their lifestyle. Recommendations include planning and keeping personal health goals in mind as an important part of their success.  Healthy Mind-Set    Healthy Minds, Bodies, Hearts  Clinical staff conducted group or individual  video education with verbal and written material and guidebook.  Patient learns how to identify when they are stressed. Video will discuss the impact of that stress, as well as the many benefits of stress management. Patient will also be introduced to stress management techniques. The way we think, act, and feel has an impact on our hearts.  How Our Thoughts Can Heal Our Hearts  Clinical staff conducted group or individual video education with verbal and written material and guidebook.  Patient learns that negative thoughts can cause depression and anxiety. This can result in negative lifestyle behavior and serious health problems. Cognitive behavioral therapy is an effective method to help control our thoughts in order to change and improve our emotional outlook.  Additional Videos:  Exercise    Improving Performance  Clinical staff conducted group or individual video education with verbal and written material and guidebook.  Patient learns to use a non-linear approach by alternating intensity levels and lengths of time spent exercising to help burn more calories and lose more body fat. Cardiovascular exercise helps improve heart health, metabolism, hormonal balance, blood sugar control, and recovery from fatigue. Resistance training improves strength, endurance, balance, coordination, reaction time, metabolism, and muscle mass. Flexibility exercise improves circulation, posture, and balance. Seek guidance from your physician and exercise physiologist before implementing an exercise routine and learn your capabilities and proper form for all exercise.  Introduction to Yoga  Clinical staff conducted group or individual video education with verbal and written material and guidebook.  Patient learns about yoga, a discipline of the coming together of mind, breath, and body. The benefits of yoga include improved flexibility, improved range of motion, better posture and core strength, increased lung  function, weight loss, and positive self-image. Yogas heart health benefits include lowered blood pressure, healthier heart rate, decreased cholesterol and triglyceride levels, improved immune function, and reduced stress. Seek guidance from your physician and exercise physiologist before implementing an exercise routine and learn your capabilities and proper form for all exercise.  Medical   Aging: Enhancing Your Quality of Life  Clinical staff conducted group or individual video education with verbal and written material and guidebook.  Patient learns key strategies and recommendations to stay in good physical health and enhance quality of life, such as prevention strategies, having an advocate, securing a Health Care Proxy and Power of Attorney, and keeping a list of medications and system for tracking  them. It also discusses how to avoid risk for bone loss.  Biology of Weight Control  Clinical staff conducted group or individual video education with verbal and written material and guidebook.  Patient learns that weight gain occurs because we consume more calories than we burn (eating more, moving less). Even if your body weight is normal, you may have higher ratios of fat compared to muscle mass. Too much body fat puts you at increased risk for cardiovascular disease, heart attack, stroke, type 2 diabetes, and obesity-related cancers. In addition to exercise, following the Pritikin Eating Plan can help reduce your risk.  Decoding Lab Results  Clinical staff conducted group or individual video education with verbal and written material and guidebook.  Patient learns that lab test reflects one measurement whose values change over time and are influenced by many factors, including medication, stress, sleep, exercise, food, hydration, pre-existing medical conditions, and more. It is recommended to use the knowledge from this video to become more involved with your lab results and evaluate your numbers  to speak with your doctor.   Diseases of Our Time - Overview  Clinical staff conducted group or individual video education with verbal and written material and guidebook.  Patient learns that according to the CDC, 50% to 70% of chronic diseases (such as obesity, type 2 diabetes, elevated lipids, hypertension, and heart disease) are avoidable through lifestyle improvements including healthier food choices, listening to satiety cues, and increased physical activity.  Sleep Disorders Clinical staff conducted group or individual video education with verbal and written material and guidebook.  Patient learns how good quality and duration of sleep are important to overall health and well-being. Patient also learns about sleep disorders and how they impact health along with recommendations to address them, including discussing with a physician.  Nutrition  Dining Out - Part 2 Clinical staff conducted group or individual video education with verbal and written material and guidebook.  Patient learns how to plan ahead and communicate in order to maximize their dining experience in a healthy and nutritious manner. Included are recommended food choices based on the type of restaurant the patient is visiting.   Fueling a Banker conducted group or individual video education with verbal and written material and guidebook.  There is a strong connection between our food choices and our health. Diseases like obesity and type 2 diabetes are very prevalent and are in large-part due to lifestyle choices. The Pritikin Eating Plan provides plenty of food and hunger-curbing satisfaction. It is easy to follow, affordable, and helps reduce health risks.  Menu Workshop  Clinical staff conducted group or individual video education with verbal and written material and guidebook.  Patient learns that restaurant meals can sabotage health goals because they are often packed with calories, fat, sodium,  and sugar. Recommendations include strategies to plan ahead and to communicate with the manager, chef, or server to help order a healthier meal.  Planning Your Eating Strategy  Clinical staff conducted group or individual video education with verbal and written material and guidebook.  Patient learns about the Pritikin Eating Plan and its benefit of reducing the risk of disease. The Pritikin Eating Plan does not focus on calories. Instead, it emphasizes high-quality, nutrient-rich foods. By knowing the characteristics of the foods, we choose, we can determine their calorie density and make informed decisions.  Targeting Your Nutrition Priorities  Clinical staff conducted group or individual video education with verbal and written material and guidebook.  Patient  learns that lifestyle habits have a tremendous impact on disease risk and progression. This video provides eating and physical activity recommendations based on your personal health goals, such as reducing LDL cholesterol, losing weight, preventing or controlling type 2 diabetes, and reducing high blood pressure.  Vitamins and Minerals  Clinical staff conducted group or individual video education with verbal and written material and guidebook.  Patient learns different ways to obtain key vitamins and minerals, including through a recommended healthy diet. It is important to discuss all supplements you take with your doctor.   Healthy Mind-Set    Smoking Cessation  Clinical staff conducted group or individual video education with verbal and written material and guidebook.  Patient learns that cigarette smoking and tobacco addiction pose a serious health risk which affects millions of people. Stopping smoking will significantly reduce the risk of heart disease, lung disease, and many forms of cancer. Recommended strategies for quitting are covered, including working with your doctor to develop a successful plan.  Culinary   Becoming a  Set Designer conducted group or individual video education with verbal and written material and guidebook.  Patient learns that cooking at home can be healthy, cost-effective, quick, and puts them in control. Keys to cooking healthy recipes will include looking at your recipe, assessing your equipment needs, planning ahead, making it simple, choosing cost-effective seasonal ingredients, and limiting the use of added fats, salts, and sugars.  Cooking - Breakfast and Snacks  Clinical staff conducted group or individual video education with verbal and written material and guidebook.  Patient learns how important breakfast is to satiety and nutrition through the entire day. Recommendations include key foods to eat during breakfast to help stabilize blood sugar levels and to prevent overeating at meals later in the day. Planning ahead is also a key component.  Cooking - Educational Psychologist conducted group or individual video education with verbal and written material and guidebook.  Patient learns eating strategies to improve overall health, including an approach to cook more at home. Recommendations include thinking of animal protein as a side on your plate rather than center stage and focusing instead on lower calorie dense options like vegetables, fruits, whole grains, and plant-based proteins, such as beans. Making sauces in large quantities to freeze for later and leaving the skin on your vegetables are also recommended to maximize your experience.  Cooking - Healthy Salads and Dressing Clinical staff conducted group or individual video education with verbal and written material and guidebook.  Patient learns that vegetables, fruits, whole grains, and legumes are the foundations of the Pritikin Eating Plan. Recommendations include how to incorporate each of these in flavorful and healthy salads, and how to create homemade salad dressings. Proper handling of ingredients is  also covered. Cooking - Soups and [employer] - Soups and Desserts Clinical staff conducted group or individual video education with verbal and written material and guidebook.  Patient learns that Pritikin soups and desserts make for easy, nutritious, and delicious snacks and meal components that are low in sodium, fat, sugar, and calorie density, while high in vitamins, minerals, and filling fiber. Recommendations include simple and healthy ideas for soups and desserts.   Overview     The Pritikin Solution Program Overview Clinical staff conducted group or individual video education with verbal and written material and guidebook.  Patient learns that the results of the Pritikin Program have been documented in more than 100 articles published in peer-reviewed  journals, and the benefits include reducing risk factors for (and, in some cases, even reversing) high cholesterol, high blood pressure, type 2 diabetes, obesity, and more! An overview of the three key pillars of the Pritikin Program will be covered: eating well, doing regular exercise, and having a healthy mind-set.  WORKSHOPS  Exercise: Exercise Basics: Building Your Action Plan Clinical staff led group instruction and group discussion with PowerPoint presentation and patient guidebook. To enhance the learning environment the use of posters, models and videos may be added. At the conclusion of this workshop, patients will comprehend the difference between physical activity and exercise, as well as the benefits of incorporating both, into their routine. Patients will understand the FITT (Frequency, Intensity, Time, and Type) principle and how to use it to build an exercise action plan. In addition, safety concerns and other considerations for exercise and cardiac rehab will be addressed by the presenter. The purpose of this lesson is to promote a comprehensive and effective weekly exercise routine in order to improve patients overall  level of fitness.   Managing Heart Disease: Your Path to a Healthier Heart Clinical staff led group instruction and group discussion with PowerPoint presentation and patient guidebook. To enhance the learning environment the use of posters, models and videos may be added.At the conclusion of this workshop, patients will understand the anatomy and physiology of the heart. Additionally, they will understand how Pritikins three pillars impact the risk factors, the progression, and the management of heart disease.  The purpose of this lesson is to provide a high-level overview of the heart, heart disease, and how the Pritikin lifestyle positively impacts risk factors.  Exercise Biomechanics Clinical staff led group instruction and group discussion with PowerPoint presentation and patient guidebook. To enhance the learning environment the use of posters, models and videos may be added. Patients will learn how the structural parts of their bodies function and how these functions impact their daily activities, movement, and exercise. Patients will learn how to promote a neutral spine, learn how to manage pain, and identify ways to improve their physical movement in order to promote healthy living. The purpose of this lesson is to expose patients to common physical limitations that impact physical activity. Participants will learn practical ways to adapt and manage aches and pains, and to minimize their effect on regular exercise. Patients will learn how to maintain good posture while sitting, walking, and lifting.  Balance Training and Fall Prevention  Clinical staff led group instruction and group discussion with PowerPoint presentation and patient guidebook. To enhance the learning environment the use of posters, models and videos may be added. At the conclusion of this workshop, patients will understand the importance of their sensorimotor skills (vision, proprioception, and the vestibular  system) in maintaining their ability to balance as they age. Patients will apply a variety of balancing exercises that are appropriate for their current level of function. Patients will understand the common causes for poor balance, possible solutions to these problems, and ways to modify their physical environment in order to minimize their fall risk. The purpose of this lesson is to teach patients about the importance of maintaining balance as they age and ways to minimize their risk of falling.  WORKSHOPS   Nutrition:  Fueling a Ship Broker led group instruction and group discussion with PowerPoint presentation and patient guidebook. To enhance the learning environment the use of posters, models and videos may be added. Patients will review the foundational principles of the Pritikin  Eating Plan and understand what constitutes a serving size in each of the food groups. Patients will also learn Pritikin-friendly foods that are better choices when away from home and review make-ahead meal and snack options. Calorie density will be reviewed and applied to three nutrition priorities: weight maintenance, weight loss, and weight gain. The purpose of this lesson is to reinforce (in a group setting) the key concepts around what patients are recommended to eat and how to apply these guidelines when away from home by planning and selecting Pritikin-friendly options. Patients will understand how calorie density may be adjusted for different weight management goals.  Mindful Eating  Clinical staff led group instruction and group discussion with PowerPoint presentation and patient guidebook. To enhance the learning environment the use of posters, models and videos may be added. Patients will briefly review the concepts of the Pritikin Eating Plan and the importance of low-calorie dense foods. The concept of mindful eating will be introduced as well as the importance of paying attention to internal  hunger signals. Triggers for non-hunger eating and techniques for dealing with triggers will be explored. The purpose of this lesson is to provide patients with the opportunity to review the basic principles of the Pritikin Eating Plan, discuss the value of eating mindfully and how to measure internal cues of hunger and fullness using the Hunger Scale. Patients will also discuss reasons for non-hunger eating and learn strategies to use for controlling emotional eating.  Targeting Your Nutrition Priorities Clinical staff led group instruction and group discussion with PowerPoint presentation and patient guidebook. To enhance the learning environment the use of posters, models and videos may be added. Patients will learn how to determine their genetic susceptibility to disease by reviewing their family history. Patients will gain insight into the importance of diet as part of an overall healthy lifestyle in mitigating the impact of genetics and other environmental insults. The purpose of this lesson is to provide patients with the opportunity to assess their personal nutrition priorities by looking at their family history, their own health history and current risk factors. Patients will also be able to discuss ways of prioritizing and modifying the Pritikin Eating Plan for their highest risk areas  Menu  Clinical staff led group instruction and group discussion with PowerPoint presentation and patient guidebook. To enhance the learning environment the use of posters, models and videos may be added. Using menus brought in from [employer], or printed from [employer], patients will apply the Pritikin dining out guidelines that were presented in the [employer] video. Patients will also be able to practice these guidelines in a variety of provided scenarios. The purpose of this lesson is to provide patients with the opportunity to practice hands-on learning of the Pritikin Dining Out  guidelines with actual menus and practice scenarios.  Label Reading Clinical staff led group instruction and group discussion with PowerPoint presentation and patient guidebook. To enhance the learning environment the use of posters, models and videos may be added. Patients will review and discuss the Pritikin label reading guidelines presented in Pritikins Label Reading Educational series video. Using fool labels brought in from local grocery stores and markets, patients will apply the label reading guidelines and determine if the packaged food meet the Pritikin guidelines. The purpose of this lesson is to provide patients with the opportunity to review, discuss, and practice hands-on learning of the Pritikin Label Reading guidelines with actual packaged food labels. Cooking School  Pritikins [employer]  are designed to teach patients ways to prepare quick, simple, and affordable recipes at home. The importance of nutritions role in chronic disease risk reduction is reflected in its emphasis in the overall Pritikin program. By learning how to prepare essential core Pritikin Eating Plan recipes, patients will increase control over what they eat; be able to customize the flavor of foods without the use of added salt, sugar, or fat; and improve the quality of the food they consume. By learning a set of core recipes which are easily assembled, quickly prepared, and affordable, patients are more likely to prepare more healthy foods at home. These workshops focus on convenient breakfasts, simple entres, side dishes, and desserts which can be prepared with minimal effort and are consistent with nutrition recommendations for cardiovascular risk reduction. Cooking [employer] are taught by a armed forces logistics/support/administrative officer (RD) who has been trained by the [employer]. The chef or RD has a clear understanding of the importance of minimizing - if not completely eliminating - added fat,  sugar, and sodium in recipes. Throughout the series of Cooking School Workshop sessions, patients will learn about healthy ingredients and efficient methods of cooking to build confidence in their capability to prepare    Cooking School weekly topics:  Adding Flavor- Sodium-Free  Fast and Healthy Breakfasts  Powerhouse Plant-Based Proteins  Satisfying Salads and Dressings  Simple Sides and Sauces  International Cuisine-Spotlight on the [employer] Zones  Delicious Desserts  Savory Soups  [employer] - Meals in a Astronomer Appetizers and Snacks  Comforting Weekend Breakfasts  One-Pot Wonders   Fast Evening Meals  Landscape Architect Your Pritikin Plate  WORKSHOPS   Healthy Mindset (Psychosocial):  Focused Goals, Sustainable Changes Clinical staff led group instruction and group discussion with PowerPoint presentation and patient guidebook. To enhance the learning environment the use of posters, models and videos may be added. Patients will be able to apply effective goal setting strategies to establish at least one personal goal, and then take consistent, meaningful action toward that goal. They will learn to identify common barriers to achieving personal goals and develop strategies to overcome them. Patients will also gain an understanding of how our mind-set can impact our ability to achieve goals and the importance of cultivating a positive and growth-oriented mind-set. The purpose of this lesson is to provide patients with a deeper understanding of how to set and achieve personal goals, as well as the tools and strategies needed to overcome common obstacles which may arise along the way.  From Head to Heart: The Power of a Healthy Outlook  Clinical staff led group instruction and group discussion with PowerPoint presentation and patient guidebook. To enhance the learning environment the use of posters, models and videos may be added. Patients will be able to recognize  and describe the impact of emotions and mood on physical health. They will discover the importance of self-care and explore self-care practices which may work for them. Patients will also learn how to utilize the 4 Cs to cultivate a healthier outlook and better manage stress and challenges. The purpose of this lesson is to demonstrate to patients how a healthy outlook is an essential part of maintaining good health, especially as they continue their cardiac rehab journey.  Healthy Sleep for a Healthy Heart Clinical staff led group instruction and group discussion with PowerPoint presentation and patient guidebook. To enhance the learning environment the use of posters, models and videos may be added. At  the conclusion of this workshop, patients will be able to demonstrate knowledge of the importance of sleep to overall health, well-being, and quality of life. They will understand the symptoms of, and treatments for, common sleep disorders. Patients will also be able to identify daytime and nighttime behaviors which impact sleep, and they will be able to apply these tools to help manage sleep-related challenges. The purpose of this lesson is to provide patients with a general overview of sleep and outline the importance of quality sleep. Patients will learn about a few of the most common sleep disorders. Patients will also be introduced to the concept of sleep hygiene, and discover ways to self-manage certain sleeping problems through simple daily behavior changes. Finally, the workshop will motivate patients by clarifying the links between quality sleep and their goals of heart-healthy living.   Recognizing and Reducing Stress Clinical staff led group instruction and group discussion with PowerPoint presentation and patient guidebook. To enhance the learning environment the use of posters, models and videos may be added. At the conclusion of this workshop, patients will be able to understand the types of  stress reactions, differentiate between acute and chronic stress, and recognize the impact that chronic stress has on their health. They will also be able to apply different coping mechanisms, such as reframing negative self-talk. Patients will have the opportunity to practice a variety of stress management techniques, such as deep abdominal breathing, progressive muscle relaxation, and/or guided imagery.  The purpose of this lesson is to educate patients on the role of stress in their lives and to provide healthy techniques for coping with it.  Learning Barriers/Preferences:  Learning Barriers/Preferences - 07/16/24 1110       Learning Barriers/Preferences   Learning Barriers Exercise Concerns   neck problems   Learning Preferences Video;Pictoral          Education Topics:  Knowledge Questionnaire Score:  Knowledge Questionnaire Score - 07/16/24 1417       Knowledge Questionnaire Score   Pre Score 24/24          Core Components/Risk Factors/Patient Goals at Admission:  Personal Goals and Risk Factors at Admission - 07/16/24 1155       Core Components/Risk Factors/Patient Goals on Admission   Lipids Yes    Intervention Provide education and support for participant on nutrition & aerobic/resistive exercise along with prescribed medications to achieve LDL 70mg , HDL >40mg .    Expected Outcomes Short Term: Participant states understanding of desired cholesterol values and is compliant with medications prescribed. Participant is following exercise prescription and nutrition guidelines.;Long Term: Cholesterol controlled with medications as prescribed, with individualized exercise RX and with personalized nutrition plan. Value goals: LDL < 70mg , HDL > 40 mg.          Core Components/Risk Factors/Patient Goals Review:   Goals and Risk Factor Review     Row Name 08/20/24 1514             Core Components/Risk Factors/Patient Goals Review   Personal Goals Review Weight  Management/Obesity;Lipids       Review Beth Chapman is off to a good start to exercise at cardiac rehab. Vital signs have been stable. Beth Chapman has increased her workloads.       Expected Outcomes Beth Chapman will continue to participate in cardiac rehab for exercise, nutrition and lifestyle modifications          Core Components/Risk Factors/Patient Goals at Discharge (Final Review):   Goals and Risk Factor Review - 08/20/24 1514  Core Components/Risk Factors/Patient Goals Review   Personal Goals Review Weight Management/Obesity;Lipids    Review Beth Chapman is off to a good start to exercise at cardiac rehab. Vital signs have been stable. Beth Chapman has increased her workloads.    Expected Outcomes Beth Chapman will continue to participate in cardiac rehab for exercise, nutrition and lifestyle modifications          ITP Comments:  ITP Comments     Row Name 07/16/24 1026 07/24/24 1127 08/20/24 1510       ITP Comments Wilbert Bihari, MD: Medical Director. Introduction to the [facility]/Intensive Cardiac Rehab. Initial orientation packet reviewed with the patient. 30-day ITP review. Beth Chapman started the cardiac rehab program today. She did well on the recumbent bike and recumbent stepper. No symptoms noted. She also attended the cooking school education class today. 30-day ITP review. Beth Chapman is off to a good start to exercise at cardiac rehab        Comments: See ITP Comments     [1]  Current Outpatient Medications:    acetaminophen  (TYLENOL ) 500 MG tablet, Take 1,000 mg by mouth every 6 (six) hours as needed for moderate pain (pain score 4-6)., Disp: , Rfl:    aspirin  81 MG chewable tablet, Chew 81 mg by mouth daily., Disp: , Rfl:    Cyanocobalamin (B-12 PO), Take 1 capsule by mouth daily., Disp: , Rfl:    cyclobenzaprine (FLEXERIL) 10 MG tablet, TAKE 1 TABLET BY MOUTH IN THE EVENING AS NEEDED, Disp: , Rfl:    fexofenadine-pseudoephedrine (ALLEGRA-D) 60-120 MG 12 hr tablet, Take 1 tablet by mouth daily as needed  (allergies)., Disp: , Rfl:    MAGNESIUM GLYCINATE PO, Take 2 capsules by mouth daily., Disp: , Rfl:    meloxicam (MOBIC) 15 MG tablet, Take 15 mg by mouth daily as needed for pain., Disp: , Rfl:    metoprolol tartrate (LOPRESSOR) 25 MG tablet, Take 12.5 mg by mouth 2 (two) times daily., Disp: , Rfl:    Multiple Vitamin (MULTIVITAMIN) tablet, Take 1 tablet by mouth daily., Disp: , Rfl:    Omega-3 Fatty Acids (FISH OIL) 1200 MG CAPS, 1 capsule., Disp: , Rfl:    Turmeric 500 MG TABS, takes 2400 mg Orally (Patient not taking: Reported on 07/16/2024), Disp: , Rfl:    TURMERIC-GINGER PO, Take 2 capsules by mouth daily., Disp: , Rfl:    VITAMIN D-VITAMIN K PO, Take 2 capsules by mouth daily., Disp: , Rfl:   Current Facility-Administered Medications:    0.9 %  sodium chloride  infusion, 500 mL, Intravenous, Once, Abran Norleen SAILOR, MD [2]  Social History Tobacco Use  Smoking Status Never  Smokeless Tobacco Never  Tobacco Comments   Never smoked 06/11/24

## 2024-08-21 ENCOUNTER — Encounter (HOSPITAL_COMMUNITY)
Admission: RE | Admit: 2024-08-21 | Discharge: 2024-08-21 | Disposition: A | Discharge status: Home / Self Care | Source: Ambulatory Visit | Attending: Cardiovascular Disease | Admitting: Cardiovascular Disease

## 2024-08-21 DIAGNOSIS — Z9889 Other specified postprocedural states: Secondary | ICD-10-CM | POA: Diagnosis not present

## 2024-08-23 ENCOUNTER — Encounter (HOSPITAL_COMMUNITY)
Admission: RE | Admit: 2024-08-23 | Discharge: 2024-08-23 | Disposition: A | Discharge status: Home / Self Care | Source: Ambulatory Visit | Attending: Cardiovascular Disease | Admitting: Cardiovascular Disease

## 2024-08-23 DIAGNOSIS — Z48812 Encounter for surgical aftercare following surgery on the circulatory system: Secondary | ICD-10-CM | POA: Diagnosis present

## 2024-08-23 DIAGNOSIS — Z9889 Other specified postprocedural states: Secondary | ICD-10-CM

## 2024-08-23 DIAGNOSIS — Z95818 Presence of other cardiac implants and grafts: Secondary | ICD-10-CM | POA: Diagnosis not present

## 2024-08-26 ENCOUNTER — Encounter (HOSPITAL_COMMUNITY)
Admission: RE | Admit: 2024-08-26 | Discharge: 2024-08-26 | Disposition: A | Discharge status: Home / Self Care | Source: Ambulatory Visit | Attending: Cardiovascular Disease | Admitting: Cardiovascular Disease

## 2024-08-26 DIAGNOSIS — Z48812 Encounter for surgical aftercare following surgery on the circulatory system: Secondary | ICD-10-CM | POA: Diagnosis not present

## 2024-08-26 DIAGNOSIS — Z9889 Other specified postprocedural states: Secondary | ICD-10-CM

## 2024-08-28 ENCOUNTER — Encounter (HOSPITAL_COMMUNITY)
Admission: RE | Admit: 2024-08-28 | Discharge: 2024-08-28 | Disposition: A | Discharge status: Home / Self Care | Source: Ambulatory Visit | Attending: Cardiovascular Disease | Admitting: Cardiovascular Disease

## 2024-08-28 DIAGNOSIS — Z9889 Other specified postprocedural states: Secondary | ICD-10-CM

## 2024-08-28 DIAGNOSIS — Z48812 Encounter for surgical aftercare following surgery on the circulatory system: Secondary | ICD-10-CM | POA: Diagnosis not present

## 2024-08-30 ENCOUNTER — Encounter (HOSPITAL_COMMUNITY)
Admission: RE | Admit: 2024-08-30 | Discharge: 2024-08-30 | Disposition: A | Source: Ambulatory Visit | Attending: Cardiovascular Disease

## 2024-08-30 DIAGNOSIS — Z48812 Encounter for surgical aftercare following surgery on the circulatory system: Secondary | ICD-10-CM | POA: Diagnosis not present

## 2024-08-30 DIAGNOSIS — Z9889 Other specified postprocedural states: Secondary | ICD-10-CM

## 2024-09-02 ENCOUNTER — Encounter (HOSPITAL_COMMUNITY)

## 2024-09-04 ENCOUNTER — Encounter (HOSPITAL_COMMUNITY)
Admission: RE | Admit: 2024-09-04 | Discharge: 2024-09-04 | Disposition: A | Discharge status: Home / Self Care | Source: Ambulatory Visit | Attending: Cardiovascular Disease | Admitting: Cardiovascular Disease

## 2024-09-04 DIAGNOSIS — Z9889 Other specified postprocedural states: Secondary | ICD-10-CM

## 2024-09-04 DIAGNOSIS — Z48812 Encounter for surgical aftercare following surgery on the circulatory system: Secondary | ICD-10-CM | POA: Diagnosis not present

## 2024-09-04 NOTE — Progress Notes (Signed)
 Reviewed home exercise guidelines with [name] including endpoints, temperature precautions, target heart rate and rate of perceived exertion. She plans to resume water aerobics 3 days/week as her mode of home exercise when she finishes cardiac rehab. [name] voices understanding of instructions given.  Arnoldo CHRISTELLA Gal, MS, ACSM CEP

## 2024-09-06 ENCOUNTER — Encounter (HOSPITAL_COMMUNITY)
Admission: RE | Admit: 2024-09-06 | Discharge: 2024-09-06 | Disposition: A | Discharge status: Home / Self Care | Source: Ambulatory Visit | Attending: Cardiovascular Disease | Admitting: Cardiovascular Disease

## 2024-09-06 DIAGNOSIS — Z9889 Other specified postprocedural states: Secondary | ICD-10-CM

## 2024-09-06 DIAGNOSIS — Z48812 Encounter for surgical aftercare following surgery on the circulatory system: Secondary | ICD-10-CM | POA: Diagnosis not present

## 2024-09-09 ENCOUNTER — Encounter (HOSPITAL_COMMUNITY)
Admission: RE | Admit: 2024-09-09 | Discharge: 2024-09-09 | Disposition: A | Source: Ambulatory Visit | Attending: Cardiovascular Disease

## 2024-09-09 DIAGNOSIS — Z9889 Other specified postprocedural states: Secondary | ICD-10-CM

## 2024-09-09 DIAGNOSIS — Z48812 Encounter for surgical aftercare following surgery on the circulatory system: Secondary | ICD-10-CM | POA: Diagnosis not present

## 2024-09-11 ENCOUNTER — Encounter (HOSPITAL_COMMUNITY)
Admission: RE | Admit: 2024-09-11 | Discharge: 2024-09-11 | Disposition: A | Source: Ambulatory Visit | Attending: Cardiovascular Disease

## 2024-09-11 DIAGNOSIS — Z48812 Encounter for surgical aftercare following surgery on the circulatory system: Secondary | ICD-10-CM | POA: Diagnosis not present

## 2024-09-11 DIAGNOSIS — Z9889 Other specified postprocedural states: Secondary | ICD-10-CM

## 2024-09-13 ENCOUNTER — Encounter (HOSPITAL_COMMUNITY)
Admission: RE | Admit: 2024-09-13 | Discharge: 2024-09-13 | Disposition: A | Source: Ambulatory Visit | Attending: Cardiovascular Disease

## 2024-09-13 DIAGNOSIS — Z48812 Encounter for surgical aftercare following surgery on the circulatory system: Secondary | ICD-10-CM | POA: Diagnosis not present

## 2024-09-13 DIAGNOSIS — Z9889 Other specified postprocedural states: Secondary | ICD-10-CM

## 2024-09-16 ENCOUNTER — Encounter (HOSPITAL_COMMUNITY)

## 2024-09-17 NOTE — Progress Notes (Signed)
 Cardiac Individual Treatment Plan  Patient Details  Name: Beth Chapman MRN: [id number] Date of Birth: Jul 25, 1951 Referring Provider:   Flowsheet Row INTENSIVE CARDIAC REHAB ORIENT from 07/16/2024 in [facility]  Referring Provider Croitoru, Jerel, MD    Initial Encounter Date:  Flowsheet Row INTENSIVE CARDIAC REHAB ORIENT from 07/16/2024 in [facility], [facility]  Date 07/16/24    Visit Diagnosis: S/P mitral valve repair at [facility], Dr Ricky  Patient's Home Medications on Admission: Current Medications[1]  Past Medical History: Past Medical History:  Diagnosis Date   Allergy    Anxiety    Arthritis    Classic migraine 11/12/2013   Heart murmur    Migraines     Tobacco Use: Tobacco Use History[2]  Labs: Review Flowsheet       Latest Ref Rng & Units 01/04/2024  Labs for ITP Cardiac and Pulmonary Rehab  PH, Arterial 7.35 - 7.45 7.407   PCO2 arterial 32 - 48 mmHg 35.8   Bicarbonate 20.0 - 28.0 mmol/L 22.6  24.8  23.7   TCO2 22 - 32 mmol/L 24  26  25    Acid-base deficit 0.0 - 2.0 mmol/L 2.0  1.0   O2 Saturation % 96  72  72     Details       Multiple values from one day are sorted in reverse-chronological order         Capillary Blood Glucose: No results found for: GLUCAP   Exercise Target Goals: Exercise Program Goal: Individual exercise prescription set using results from initial 6 min walk test and THRR while considering  patients activity barriers and safety.   Exercise Prescription Goal: Initial exercise prescription builds to 30-45 minutes a day of aerobic activity, 2-3 days per week.  Home exercise guidelines will be given to patient during program as part of exercise prescription that the participant will acknowledge.  Activity Barriers & Risk Stratification:  Activity Barriers & Cardiac Risk Stratification - 07/16/24 1155       Activity  Barriers & Cardiac Risk Stratification   Activity Barriers Back Problems;Neck/Spine Problems;Shortness of Breath;Other (comment)    Comments Ruptured disc in neck and low back, left hip pain, OA.    Cardiac Risk Stratification Low          6 Minute Walk:  6 Minute Walk     Row Name 07/16/24 1210         6 Minute Walk   Phase Initial     Distance 1613 feet     Walk Time 6 minutes     # of Rest Breaks 0     MPH 3.05     METS 3.71     RPE 11     Perceived Dyspnea  1     VO2 Peak 12.99     Symptoms Yes (comment)     Comments Mild shortness of breath.     Resting HR 84 bpm     Resting BP 104/78     Resting Oxygen Saturation  97 %     Exercise Oxygen Saturation  during 6 min walk 100 %     Max Ex. HR 122 bpm     Max Ex. BP 132/62     2 Minute Post BP 128/78        Oxygen Initial Assessment:   Oxygen Re-Evaluation:   Oxygen Discharge (Final Oxygen Re-Evaluation):   Initial Exercise  Prescription:  Initial Exercise Prescription - 07/16/24 1200       Date of Initial Exercise RX and Referring Provider   Date 07/16/24    Referring Provider Croitoru, Jerel, MD    Expected Discharge Date 10/09/24      Recumbant Bike   Level 1    RPM 30    Watts 20    Minutes 15    METs 3      NuStep   Level 1    SPM 85    Minutes 15    METs 3      Prescription Details   Frequency (times per week) 3    Duration Progress to 30 minutes of continuous aerobic without signs/symptoms of physical distress      Intensity   THRR 40-80% of Max Heartrate 60-119    Ratings of Perceived Exertion 11-13    Perceived Dyspnea 0-4      Progression   Progression Continue to progress workloads to maintain intensity without signs/symptoms of physical distress.      Resistance Training   Training Prescription Yes    Weight 2 lbs    Reps 10-15          Perform Capillary Blood Glucose checks as needed.  Exercise Prescription Changes:   Exercise Prescription Changes     Row  Name 07/24/24 1035 08/05/24 1027 08/12/24 1027 08/21/24 1026 09/04/24 1016     Response to Exercise   Blood Pressure (Admit) 102/68 124/68 118/68 114/84 128/72   Blood Pressure (Exercise) 124/66 142/86 122/72 122/80 --   Blood Pressure (Exit) 100/64 126/84 102/66 104/64 104/72   Heart Rate (Admit) 93 bpm 78 bpm 83 bpm 82 bpm 86 bpm   Heart Rate (Exercise) 110 bpm 115 bpm 113 bpm 120 bpm 123 bpm   Heart Rate (Exit) 102 bpm 87 bpm 92 bpm 94 bpm 95 bpm   Rating of Perceived Exertion (Exercise) 12 12 12 12 10    Symptoms None None None None None   Comments Off to a good start with exercise. Off to a good start with exercise. Increased workload on recumbent stepper. Reviewed goals with [name]. Reviewed home exercise guidelines, METs, and goals with [name].   Duration Continue with 30 min of aerobic exercise without signs/symptoms of physical distress. Continue with 30 min of aerobic exercise without signs/symptoms of physical distress. Continue with 30 min of aerobic exercise without signs/symptoms of physical distress. Continue with 30 min of aerobic exercise without signs/symptoms of physical distress. Continue with 30 min of aerobic exercise without signs/symptoms of physical distress.   Intensity THRR unchanged THRR unchanged THRR unchanged THRR unchanged THRR unchanged     Progression   Progression Continue to progress workloads to maintain intensity without signs/symptoms of physical distress. Continue to progress workloads to maintain intensity without signs/symptoms of physical distress. Continue to progress workloads to maintain intensity without signs/symptoms of physical distress. Continue to progress workloads to maintain intensity without signs/symptoms of physical distress. Continue to progress workloads to maintain intensity without signs/symptoms of physical distress.   Average METs 2.2 2.7 3.4 3.1 3.9     Resistance Training   Training Prescription No -- Yes No Yes   Weight Relaxation day.  No weights. 2 lbs 2 lbs Relaxation day. No weights. Relaxation day. No weights.   Reps -- 10-15 10-15 -- --   Time -- 5 Minutes 5 Minutes -- --     Interval Training   Interval Training No No No No No  Recumbant Bike   Level 1 1 2 2 2    RPM 53 73 81 77 86   Watts 14 18 23 21 23    Minutes 15 15 15 15 15    METs 2 2.3 3.3 2.8 3.1     NuStep   Level 1 1 2 2 2    SPM 78 98 106 100 118   Minutes 15 15 15 15 15    METs 2.5 3.2 3.6 3.4 4.7     Home Exercise Plan   Plans to continue exercise at -- -- -- -- [employer] (comment)  water aerobics   Frequency -- -- -- -- Add 3 additional days to program exercise sessions.   Initial Home Exercises Provided -- -- -- -- 09/05/23    Row Name 09/13/24 1014             Response to Exercise   Blood Pressure (Admit) 106/68       Blood Pressure (Exit) 104/64       Heart Rate (Admit) 75 bpm       Heart Rate (Exercise) 112 bpm       Heart Rate (Exit) 85 bpm       Rating of Perceived Exertion (Exercise) 10       Symptoms None       Duration Continue with 30 min of aerobic exercise without signs/symptoms of physical distress.       Intensity THRR unchanged         Progression   Progression Continue to progress workloads to maintain intensity without signs/symptoms of physical distress.       Average METs 3.6         Resistance Training   Training Prescription Yes       Weight 2 lbs       Reps 10-15       Time 5 Minutes         Interval Training   Interval Training No         Recumbant Bike   Level 2       RPM 77       Watts 24       Minutes 15       METs 2.7         NuStep   Level 2       SPM 117       Minutes 15       METs 4.5         Home Exercise Plan   Plans to continue exercise at [employer] (comment)  water aerobics       Frequency Add 3 additional days to program exercise sessions.       Initial Home Exercises Provided 09/05/23          Exercise Comments:   Exercise Comments     Row Name  07/24/24 1127 08/21/24 1109 09/04/24 1103       Exercise Comments [name] tolerated first session of exercise well without symptoms. Oriented her to the exercise equipment and stretching routine. Reviewed goals with [name]. Reviewed home exercise, METs, and goals with [name].        Exercise Goals and Review:   Exercise Goals     Row Name 07/16/24 1155             Exercise Goals   Increase Physical Activity Yes       Intervention Provide advice, education, support and counseling about physical activity/exercise needs.;Develop an individualized exercise prescription  for aerobic and resistive training based on initial evaluation findings, risk stratification, comorbidities and participant's personal goals.       Expected Outcomes Short Term: Attend rehab on a regular basis to increase amount of physical activity.;Long Term: Add in home exercise to make exercise part of routine and to increase amount of physical activity.;Long Term: Exercising regularly at least 3-5 days a week.       Increase Strength and Stamina Yes       Intervention Provide advice, education, support and counseling about physical activity/exercise needs.;Develop an individualized exercise prescription for aerobic and resistive training based on initial evaluation findings, risk stratification, comorbidities and participant's personal goals.       Expected Outcomes Short Term: Increase workloads from initial exercise prescription for resistance, speed, and METs.;Short Term: Perform resistance training exercises routinely during rehab and add in resistance training at home;Long Term: Improve cardiorespiratory fitness, muscular endurance and strength as measured by increased METs and functional capacity ( )       Able to understand and use rate of perceived exertion (RPE) scale Yes       Intervention Provide education and explanation on how to use RPE scale       Expected Outcomes Short Term: Able to use RPE daily in rehab to  express subjective intensity level;Long Term:  Able to use RPE to guide intensity level when exercising independently       Knowledge and understanding of Target Heart Rate Range (THRR) Yes       Intervention Provide education and explanation of THRR including how the numbers were predicted and where they are located for reference       Expected Outcomes Short Term: Able to state/look up THRR;Long Term: Able to use THRR to govern intensity when exercising independently;Short Term: Able to use daily as guideline for intensity in rehab       Able to check pulse independently Yes       Intervention Provide education and demonstration on how to check pulse in carotid and radial arteries.;Review the importance of being able to check your own pulse for safety during independent exercise       Expected Outcomes Short Term: Able to explain why pulse checking is important during independent exercise;Long Term: Able to check pulse independently and accurately       Understanding of Exercise Prescription Yes       Intervention Provide education, explanation, and written materials on patient's individual exercise prescription       Expected Outcomes Short Term: Able to explain program exercise prescription;Long Term: Able to explain home exercise prescription to exercise independently          Exercise Goals Re-Evaluation :  Exercise Goals Re-Evaluation     Row Name 07/24/24 1127 08/12/24 1133 08/21/24 1109 09/04/24 1103       Exercise Goal Re-Evaluation   Exercise Goals Review Increase Physical Activity;Increase Strength and Stamina;Able to understand and use rate of perceived exertion (RPE) scale Increase Physical Activity;Increase Strength and Stamina;Able to understand and use rate of perceived exertion (RPE) scale Increase Physical Activity;Increase Strength and Stamina;Able to understand and use rate of perceived exertion (RPE) scale Increase Physical Activity;Increase Strength and Stamina;Able to  understand and use rate of perceived exertion (RPE) scale;Knowledge and understanding of Target Heart Rate Range (THRR);Able to check pulse independently;Understanding of Exercise Prescription    Comments [name] is able to understand and use the RPE scale appropriately. [name] is making good progress with exercise. [name] was previously  doing water aerobics 3 days/week. Her class meets on MWF at 645. Discussed getting back to 1 or 2 classes and see how she feels. Reviewed exercise prescription with [name]. She previously did water aerobics on MON, WED, FRI. She will resume water aerobics upon completion of the program. She has a smart watch to monitor her pulse.    Expected Outcomes Progress workloads as tolerated to help improve cardiorespiratory fitness. Continue to increase workloads as tolerated. [name] will return to water aerobics. Continue to progress workloads. Progress workloads at cardiac rehab to increase strength and stamina.       Discharge Exercise Prescription (Final Exercise Prescription Changes):  Exercise Prescription Changes - 09/13/24 1014       Response to Exercise   Blood Pressure (Admit) 106/68    Blood Pressure (Exit) 104/64    Heart Rate (Admit) 75 bpm    Heart Rate (Exercise) 112 bpm    Heart Rate (Exit) 85 bpm    Rating of Perceived Exertion (Exercise) 10    Symptoms None    Duration Continue with 30 min of aerobic exercise without signs/symptoms of physical distress.    Intensity THRR unchanged      Progression   Progression Continue to progress workloads to maintain intensity without signs/symptoms of physical distress.    Average METs 3.6      Resistance Training   Training Prescription Yes    Weight 2 lbs    Reps 10-15    Time 5 Minutes      Interval Training   Interval Training No      Recumbant Bike   Level 2    RPM 77    Watts 24    Minutes 15    METs 2.7      NuStep   Level 2    SPM 117    Minutes 15    METs 4.5      Home Exercise Plan   Plans to  continue exercise at [employer] (comment)   water aerobics   Frequency Add 3 additional days to program exercise sessions.    Initial Home Exercises Provided 09/05/23          Nutrition:  Target Goals: Understanding of nutrition guidelines, daily intake of sodium 1500mg , cholesterol 200mg , calories 30% from fat and 7% or less from saturated fats, daily to have 5 or more servings of fruits and vegetables.  Biometrics:  Pre Biometrics - 07/16/24 1257       Pre Biometrics   Height 5' 2.75 (1.594 m)    Weight 60.9 kg    Waist Circumference 31 inches    Hip Circumference 37.5 inches    Waist to Hip Ratio 0.83 %    BMI (Calculated) 23.97    Triceps Skinfold 22 mm    % Body Fat 34.8 %    Grip Strength 14 kg    Flexibility --   Not performed. Ruptured disc in neck and low back.   Single Leg Stand 30 seconds           Nutrition Therapy Plan and Nutrition Goals:  Nutrition Therapy & Goals - 07/24/24 1106       Nutrition Therapy   Diet Heart Healthy      Personal Nutrition Goals   Nutrition Goal Patient to identify strategies for reducing cardiovascular risk by attending the [employer] education and nutrition series weekly.    Comments Patient with history of s/p MVR on 05/13/24 at [facility], HLD, and atrial fibrillation. Endorses  following heart healthy diet since husband's heart attack several years ago. Based on PYP survey, may further improve diet by increasing intake of plant-based foods. Patient will benefit from participation in intensive cardiac rehab for nutrition education, exercise, and lifestyle modification.      Intervention Plan   Intervention Prescribe, educate and counsel regarding individualized specific dietary modifications aiming towards targeted core components such as weight, hypertension, lipid management, diabetes, heart failure and other comorbidities.    Expected Outcomes Short Term Goal: Understand basic principles of dietary content, such as  calories, fat, sodium, cholesterol and nutrients.;Long Term Goal: Adherence to prescribed nutrition plan.          Nutrition Assessments:  MEDIFICTS Score Key: >=70 Need to make dietary changes  40-70 Heart Healthy Diet <= 40 Therapeutic Level Cholesterol Diet   Flowsheet Row INTENSIVE CARDIAC REHAB from 07/24/2024 in [facility], Vascular, & Lung [facility]  Picture Your Plate Total Score on Admission 74   Picture Your Plate Scores: <59 Unhealthy dietary pattern with much room for improvement. 41-50 Dietary pattern unlikely to meet recommendations for good health and room for improvement. 51-60 More healthful dietary pattern, with some room for improvement.  >60 Healthy dietary pattern, although there may be some specific behaviors that could be improved.    Nutrition Goals Re-Evaluation:  Nutrition Goals Re-Evaluation     Row Name 08/21/24 1110 09/13/24 1050           Goals   Current Weight 133 lb 13.1 oz (60.7 kg)  08/12/2024 133 lb 13.1 oz (60.7 kg)      Nutrition Goal Patient to identify strategies for reducing cardiovascular risk by attending the [employer] education and nutrition series weekly. Patient to identify strategies for reducing cardiovascular risk by attending the [employer] education and nutrition series weekly.      Comment stable wt Wt stable since starting cardiac rehab.      Expected Outcome Goal in action. Patient with history of s/p MVR on 05/13/24 at [facility], HLD, and atrial fibrillation. Pt continues to follow heart healthy guidelines; participating in nutrition classes. Denies any obstacles with current diet. Patient will benefit from ongoing participation in intensive cardiac rehab for nutrition education, exercise, and lifestyle modification. Goal in action. Patient with history of s/p MVR on 05/13/24 at [facility], HLD, and atrial fibrillation. Pt continues to follow heart healthy guidelines; participating in nutrition classes. Denies any  obstacles with current diet. Expresses adequate understanding of key components of diet. Patient will benefit from ongoing participation in intensive cardiac rehab for nutrition education, exercise, and lifestyle modification.         Nutrition Goals Re-Evaluation:  Nutrition Goals Re-Evaluation     Row Name 08/21/24 1110 09/13/24 1050           Goals   Current Weight 133 lb 13.1 oz (60.7 kg)  08/12/2024 133 lb 13.1 oz (60.7 kg)      Nutrition Goal Patient to identify strategies for reducing cardiovascular risk by attending the [employer] education and nutrition series weekly. Patient to identify strategies for reducing cardiovascular risk by attending the [employer] education and nutrition series weekly.      Comment stable wt Wt stable since starting cardiac rehab.      Expected Outcome Goal in action. Patient with history of s/p MVR on 05/13/24 at [facility], HLD, and atrial fibrillation. Pt continues to follow heart healthy guidelines; participating in nutrition classes. Denies any obstacles with current diet. Patient will benefit from  ongoing participation in intensive cardiac rehab for nutrition education, exercise, and lifestyle modification. Goal in action. Patient with history of s/p MVR on 05/13/24 at [facility], HLD, and atrial fibrillation. Pt continues to follow heart healthy guidelines; participating in nutrition classes. Denies any obstacles with current diet. Expresses adequate understanding of key components of diet. Patient will benefit from ongoing participation in intensive cardiac rehab for nutrition education, exercise, and lifestyle modification.         Nutrition Goals Discharge (Final Nutrition Goals Re-Evaluation):  Nutrition Goals Re-Evaluation - 09/13/24 1050       Goals   Current Weight 133 lb 13.1 oz (60.7 kg)    Nutrition Goal Patient to identify strategies for reducing cardiovascular risk by attending the [employer] education and nutrition series weekly.    Comment Wt stable  since starting cardiac rehab.    Expected Outcome Goal in action. Patient with history of s/p MVR on 05/13/24 at [facility], HLD, and atrial fibrillation. Pt continues to follow heart healthy guidelines; participating in nutrition classes. Denies any obstacles with current diet. Expresses adequate understanding of key components of diet. Patient will benefit from ongoing participation in intensive cardiac rehab for nutrition education, exercise, and lifestyle modification.          Psychosocial: Target Goals: Acknowledge presence or absence of significant depression and/or stress, maximize coping skills, provide positive support system. Participant is able to verbalize types and ability to use techniques and skills needed for reducing stress and depression.  Initial Review & Psychosocial Screening:  Initial Psych Review & Screening - 07/16/24 1108       Initial Review   Current issues with None Identified      Family Dynamics   Good Support System? Yes   [name] lives with her husband who she has for support. [name] also has friends that she can rely on. [name]'s 50 year old mother lives out of town     Barriers   Psychosocial barriers to participate in program The patient should benefit from training in stress management and relaxation.      Screening Interventions   Interventions Encouraged to exercise    Expected Outcomes Long Term goal: The participant improves quality of Life and PHQ9 Scores as seen by post scores and/or verbalization of changes;Short Term goal: Identification and review with participant of any Quality of Life or Depression concerns found by scoring the questionnaire.          Quality of Life Scores:  Quality of Life - 07/16/24 1416       Quality of Life   Select Quality of Life      Quality of Life Scores   Health/Function Pre 22.4 %    Socioeconomic Pre 26.57 %    Psych/Spiritual Pre 24.86 %    Family Pre 25.5 %    GLOBAL Pre 24.18 %         Scores of 19 and  below usually indicate a poorer quality of life in these areas.  A difference of  2-3 points is a clinically meaningful difference.  A difference of 2-3 points in the total score of the Quality of Life Index has been associated with significant improvement in overall quality of life, self-image, physical symptoms, and general health in studies assessing change in quality of life.  PHQ-9: Review Flowsheet       07/16/2024  Depression screen PHQ 2/9  Decreased Interest 0  Down, Depressed, Hopeless 0  PHQ - 2 Score 0  Altered sleeping  1  Tired, decreased energy 1  Change in appetite 0  Feeling bad or failure about yourself  0  Trouble concentrating 0  Moving slowly or fidgety/restless 0  Suicidal thoughts 0  PHQ-9 Score 2  Difficult doing work/chores Not difficult at all   Interpretation of Total Score  Total Score Depression Severity:  1-4 = Minimal depression, 5-9 = Mild depression, 10-14 = Moderate depression, 15-19 = Moderately severe depression, 20-27 = Severe depression   Psychosocial Evaluation and Intervention:   Psychosocial Re-Evaluation:  Psychosocial Re-Evaluation     Row Name 08/20/24 1511 08/20/24 1512 09/17/24 1321         Psychosocial Re-Evaluation   Current issues with None Identified Current Sleep Concerns None Identified     Comments -- Reviewed PHQ9 at orientation. [name] says she has difficulty sleeping at times discussed sleep hygiene. --     Interventions Encouraged to attend Cardiac Rehabilitation for the exercise Encouraged to attend Cardiac Rehabilitation for the exercise Encouraged to attend Cardiac Rehabilitation for the exercise     Continue Psychosocial Services  No Follow up required No Follow up required No Follow up required        Psychosocial Discharge (Final Psychosocial Re-Evaluation):  Psychosocial Re-Evaluation - 09/17/24 1321       Psychosocial Re-Evaluation   Current issues with None Identified    Interventions Encouraged to attend  Cardiac Rehabilitation for the exercise    Continue Psychosocial Services  No Follow up required          Vocational Rehabilitation: Provide vocational rehab assistance to qualifying candidates.   Vocational Rehab Evaluation & Intervention:  Vocational Rehab - 07/16/24 1110       Initial Vocational Rehab Evaluation & Intervention   Assessment shows need for Vocational Rehabilitation No   [name] is retired and does not need vocational rehab at this time         Education: Education Goals: Education classes will be provided on a weekly basis, covering required topics. Participant will state understanding/return demonstration of topics presented.    Education     Row Name 07/24/24 1000     Education   Cardiac Education Topics [employer]   Customer Service Manager   Weekly Topic Comforting Weekend Breakfasts   Instruction Review Code 1- Verbalizes Understanding   Class Start Time 1145   Class Stop Time 1224   Class Time Calculation (min) 39 min    Row Name 07/26/24 1000     Education   Cardiac Education Topics Pritikin   Licensed Conveyancer Nutrition   Nutrition Dining Out - Part 1   Instruction Review Code 1- Verbalizes Understanding   Class Start Time 1150   Class Stop Time 1227   Class Time Calculation (min) 37 min    Row Name 07/31/24 1100     Education   Cardiac Education Topics Pritikin   Orthoptist   Educator Dietitian   Weekly Topic Fast Evening Meals   Instruction Review Code 1- Verbalizes Understanding   Class Start Time 1148   Class Stop Time 1232   Class Time Calculation (min) 44 min    Row Name 08/05/24 1100     Education   Cardiac Education Topics Pritikin   Nurse, Children's Exercise Physiologist  Select Exercise Education   Exercise Education Improving Performance   Instruction Review Code 1-  Verbalizes Understanding   Class Start Time 1150   Class Stop Time 1225   Class Time Calculation (min) 35 min    Row Name 08/05/24 1100     Education   Cardiac Education Topics Pritikin   Psychologist, Forensic Exercise Education   Exercise Education Improving Performance   Instruction Review Code 1- Verbalizes Understanding    Row Name 08/09/24 1300     Education   Cardiac Education Topics Pritikin   Glass Blower/designer Nutrition   Nutrition Workshop Fueling a Forensic Psychologist   Instruction Review Code 1- Tefl Teacher Understanding   Class Start Time 1150   Class Stop Time 1229   Class Time Calculation (min) 39 min    Row Name 08/12/24 1000     Education   Cardiac Education Topics Pritikin   Select Workshops     Workshops   Educator Exercise Physiologist   Select Psychosocial   Psychosocial Workshop Healthy Sleep for a Healthy Heart   Instruction Review Code 1- Verbalizes Understanding   Class Start Time 1150   Class Stop Time 1237   Class Time Calculation (min) 47 min    Row Name 08/23/24 1400     Education   Cardiac Education Topics Pritikin   Select Core Videos     Core Videos   Educator Dietitian   Select Nutrition   Nutrition Facts on Fat   Instruction Review Code 1- Verbalizes Understanding   Class Start Time 1145   Class Stop Time 1219   Class Time Calculation (min) 34 min    Row Name 08/26/24 1100     Education   Cardiac Education Topics Pritikin   Geographical Information Systems Officer Psychosocial   Psychosocial Workshop From Head to Heart: The Power of a Healthy Outlook   Instruction Review Code 1- Verbalizes Understanding   Class Start Time 1145   Class Stop Time 1229   Class Time Calculation (min) 44 min    Row Name 09/04/24 1300     Education   Cardiac Education Topics Pritikin   Teacher, Music   Weekly Topic Adding Flavor - Sodium-Free   Instruction Review Code 1- Verbalizes Understanding   Class Start Time 1146   Class Stop Time 1220   Class Time Calculation (min) 34 min    Row Name 09/06/24 1000     Education   Cardiac Education Topics Pritikin   Nurse, Children's Exercise Physiologist   Select Psychosocial   Psychosocial Healthy Minds, Bodies, Hearts   Instruction Review Code 1- Verbalizes Understanding   Class Start Time 1144   Class Stop Time 1220   Class Time Calculation (min) 36 min    Row Name 09/09/24 1100     Education   Cardiac Education Topics Pritikin   Engineer, Mining Education   General Education Heart Disease Risk Reduction   Instruction Review Code 1- Verbalizes Understanding   Class Start Time 1153   Class Stop Time 1233   Class Time Calculation (min)  40 min    Row Name 09/11/24 1100     Education   Cardiac Education Topics Pritikin   Customer Service Manager   Weekly Topic Fast and Healthy Breakfasts   Instruction Review Code 1- Verbalizes Understanding   Class Start Time 1145   Class Stop Time 1224   Class Time Calculation (min) 39 min      Core Videos: Exercise    Move It!  Clinical staff conducted group or individual video education with verbal and written material and guidebook.  Patient learns the recommended Pritikin exercise program. Exercise with the goal of living a long, healthy life. Some of the health benefits of exercise include controlled diabetes, healthier blood pressure levels, improved cholesterol levels, improved heart and lung capacity, improved sleep, and better body composition. Everyone should speak with their doctor before starting or changing an exercise routine.  Biomechanical Limitations Clinical staff conducted group or individual video  education with verbal and written material and guidebook.  Patient learns how biomechanical limitations can impact exercise and how we can mitigate and possibly overcome limitations to have an impactful and balanced exercise routine.  Body Composition Clinical staff conducted group or individual video education with verbal and written material and guidebook.  Patient learns that body composition (ratio of muscle mass to fat mass) is a key component to assessing overall fitness, rather than body weight alone. Increased fat mass, especially visceral belly fat, can put us  at increased risk for metabolic syndrome, type 2 diabetes, heart disease, and even death. It is recommended to combine diet and exercise (cardiovascular and resistance training) to improve your body composition. Seek guidance from your physician and exercise physiologist before implementing an exercise routine.  Exercise Action Plan Clinical staff conducted group or individual video education with verbal and written material and guidebook.  Patient learns the recommended strategies to achieve and enjoy long-term exercise adherence, including variety, self-motivation, self-efficacy, and positive decision making. Benefits of exercise include fitness, good health, weight management, more energy, better sleep, less stress, and overall well-being.  Medical   Heart Disease Risk Reduction Clinical staff conducted group or individual video education with verbal and written material and guidebook.  Patient learns our heart is our most vital organ as it circulates oxygen, nutrients, white blood cells, and hormones throughout the entire body, and carries waste away. Data supports a plant-based eating plan like the Pritikin Program for its effectiveness in slowing progression of and reversing heart disease. The video provides a number of recommendations to address heart disease.   Metabolic Syndrome and Belly Fat  Clinical staff conducted group  or individual video education with verbal and written material and guidebook.  Patient learns what metabolic syndrome is, how it leads to heart disease, and how one can reverse it and keep it from coming back. You have metabolic syndrome if you have 3 of the following 5 criteria: abdominal obesity, high blood pressure, high triglycerides, low HDL cholesterol, and high blood sugar.  Hypertension and Heart Disease Clinical staff conducted group or individual video education with verbal and written material and guidebook.  Patient learns that high blood pressure, or hypertension, is very common in the [place] . Hypertension is largely due to excessive salt intake, but other important risk factors include being overweight, physical inactivity, drinking too much alcohol, smoking, and not eating enough potassium from fruits and vegetables. High blood pressure is a leading risk factor for heart attack, stroke, congestive  heart failure, dementia, kidney failure, and premature death. Long-term effects of excessive salt intake include stiffening of the arteries and thickening of heart muscle and organ damage. Recommendations include ways to reduce hypertension and the risk of heart disease.  Diseases of Our Time - Focusing on Diabetes Clinical staff conducted group or individual video education with verbal and written material and guidebook.  Patient learns why the best way to stop diseases of our time is prevention, through food and other lifestyle changes. Medicine (such as prescription pills and surgeries) is often only a Band-Aid on the problem, not a long-term solution. Most common diseases of our time include obesity, type 2 diabetes, hypertension, heart disease, and cancer. The Pritikin Program is recommended and has been proven to help reduce, reverse, and/or prevent the damaging effects of metabolic syndrome.  Nutrition   Overview of the Pritikin Eating Plan  Clinical staff conducted group or  individual video education with verbal and written material and guidebook.  Patient learns about the Pritikin Eating Plan for disease risk reduction. The Pritikin Eating Plan emphasizes a wide variety of unrefined, minimally-processed carbohydrates, like fruits, vegetables, whole grains, and legumes. Go, Caution, and Stop food choices are explained. Plant-based and lean animal proteins are emphasized. Rationale provided for low sodium intake for blood pressure control, low added sugars for blood sugar stabilization, and low added fats and oils for coronary artery disease risk reduction and weight management.  Calorie Density  Clinical staff conducted group or individual video education with verbal and written material and guidebook.  Patient learns about calorie density and how it impacts the Pritikin Eating Plan. Knowing the characteristics of the food you choose will help you decide whether those foods will lead to weight gain or weight loss, and whether you want to consume more or less of them. Weight loss is usually a side effect of the Pritikin Eating Plan because of its focus on low calorie-dense foods.  Label Reading  Clinical staff conducted group or individual video education with verbal and written material and guidebook.  Patient learns about the Pritikin recommended label reading guidelines and corresponding recommendations regarding calorie density, added sugars, sodium content, and whole grains.  Dining Out - Part 1  Clinical staff conducted group or individual video education with verbal and written material and guidebook.  Patient learns that restaurant meals can be sabotaging because they can be so high in calories, fat, sodium, and/or sugar. Patient learns recommended strategies on how to positively address this and avoid unhealthy pitfalls.  Facts on Fats  Clinical staff conducted group or individual video education with verbal and written material and guidebook.  Patient learns  that lifestyle modifications can be just as effective, if not more so, as many medications for lowering your risk of heart disease. A Pritikin lifestyle can help to reduce your risk of inflammation and atherosclerosis (cholesterol build-up, or plaque, in the artery walls). Lifestyle interventions such as dietary choices and physical activity address the cause of atherosclerosis. A review of the types of fats and their impact on blood cholesterol levels, along with dietary recommendations to reduce fat intake is also included.  Nutrition Action Plan  Clinical staff conducted group or individual video education with verbal and written material and guidebook.  Patient learns how to incorporate Pritikin recommendations into their lifestyle. Recommendations include planning and keeping personal health goals in mind as an important part of their success.  Healthy Mind-Set    Healthy Minds, Bodies, Hearts  Clinical staff conducted group  or individual video education with verbal and written material and guidebook.  Patient learns how to identify when they are stressed. Video will discuss the impact of that stress, as well as the many benefits of stress management. Patient will also be introduced to stress management techniques. The way we think, act, and feel has an impact on our hearts.  How Our Thoughts Can Heal Our Hearts  Clinical staff conducted group or individual video education with verbal and written material and guidebook.  Patient learns that negative thoughts can cause depression and anxiety. This can result in negative lifestyle behavior and serious health problems. Cognitive behavioral therapy is an effective method to help control our thoughts in order to change and improve our emotional outlook.  Additional Videos:  Exercise    Improving Performance  Clinical staff conducted group or individual video education with verbal and written material and guidebook.  Patient learns to use a  non-linear approach by alternating intensity levels and lengths of time spent exercising to help burn more calories and lose more body fat. Cardiovascular exercise helps improve heart health, metabolism, hormonal balance, blood sugar control, and recovery from fatigue. Resistance training improves strength, endurance, balance, coordination, reaction time, metabolism, and muscle mass. Flexibility exercise improves circulation, posture, and balance. Seek guidance from your physician and exercise physiologist before implementing an exercise routine and learn your capabilities and proper form for all exercise.  Introduction to Yoga  Clinical staff conducted group or individual video education with verbal and written material and guidebook.  Patient learns about yoga, a discipline of the coming together of mind, breath, and body. The benefits of yoga include improved flexibility, improved range of motion, better posture and core strength, increased lung function, weight loss, and positive self-image. Yogas heart health benefits include lowered blood pressure, healthier heart rate, decreased cholesterol and triglyceride levels, improved immune function, and reduced stress. Seek guidance from your physician and exercise physiologist before implementing an exercise routine and learn your capabilities and proper form for all exercise.  Medical   Aging: Enhancing Your Quality of Life  Clinical staff conducted group or individual video education with verbal and written material and guidebook.  Patient learns key strategies and recommendations to stay in good physical health and enhance quality of life, such as prevention strategies, having an advocate, securing a Health Care Proxy and Power of Attorney, and keeping a list of medications and system for tracking them. It also discusses how to avoid risk for bone loss.  Biology of Weight Control  Clinical staff conducted group or individual video education with  verbal and written material and guidebook.  Patient learns that weight gain occurs because we consume more calories than we burn (eating more, moving less). Even if your body weight is normal, you may have higher ratios of fat compared to muscle mass. Too much body fat puts you at increased risk for cardiovascular disease, heart attack, stroke, type 2 diabetes, and obesity-related cancers. In addition to exercise, following the Pritikin Eating Plan can help reduce your risk.  Decoding Lab Results  Clinical staff conducted group or individual video education with verbal and written material and guidebook.  Patient learns that lab test reflects one measurement whose values change over time and are influenced by many factors, including medication, stress, sleep, exercise, food, hydration, pre-existing medical conditions, and more. It is recommended to use the knowledge from this video to become more involved with your lab results and evaluate your numbers to speak with your doctor.  Diseases of Our Time - Overview  Clinical staff conducted group or individual video education with verbal and written material and guidebook.  Patient learns that according to the CDC, 50% to 70% of chronic diseases (such as obesity, type 2 diabetes, elevated lipids, hypertension, and heart disease) are avoidable through lifestyle improvements including healthier food choices, listening to satiety cues, and increased physical activity.  Sleep Disorders Clinical staff conducted group or individual video education with verbal and written material and guidebook.  Patient learns how good quality and duration of sleep are important to overall health and well-being. Patient also learns about sleep disorders and how they impact health along with recommendations to address them, including discussing with a physician.  Nutrition  Dining Out - Part 2 Clinical staff conducted group or individual video education with verbal and  written material and guidebook.  Patient learns how to plan ahead and communicate in order to maximize their dining experience in a healthy and nutritious manner. Included are recommended food choices based on the type of restaurant the patient is visiting.   Fueling a Banker conducted group or individual video education with verbal and written material and guidebook.  There is a strong connection between our food choices and our health. Diseases like obesity and type 2 diabetes are very prevalent and are in large-part due to lifestyle choices. The Pritikin Eating Plan provides plenty of food and hunger-curbing satisfaction. It is easy to follow, affordable, and helps reduce health risks.  Menu Workshop  Clinical staff conducted group or individual video education with verbal and written material and guidebook.  Patient learns that restaurant meals can sabotage health goals because they are often packed with calories, fat, sodium, and sugar. Recommendations include strategies to plan ahead and to communicate with the manager, chef, or server to help order a healthier meal.  Planning Your Eating Strategy  Clinical staff conducted group or individual video education with verbal and written material and guidebook.  Patient learns about the Pritikin Eating Plan and its benefit of reducing the risk of disease. The Pritikin Eating Plan does not focus on calories. Instead, it emphasizes high-quality, nutrient-rich foods. By knowing the characteristics of the foods, we choose, we can determine their calorie density and make informed decisions.  Targeting Your Nutrition Priorities  Clinical staff conducted group or individual video education with verbal and written material and guidebook.  Patient learns that lifestyle habits have a tremendous impact on disease risk and progression. This video provides eating and physical activity recommendations based on your personal health goals,  such as reducing LDL cholesterol, losing weight, preventing or controlling type 2 diabetes, and reducing high blood pressure.  Vitamins and Minerals  Clinical staff conducted group or individual video education with verbal and written material and guidebook.  Patient learns different ways to obtain key vitamins and minerals, including through a recommended healthy diet. It is important to discuss all supplements you take with your doctor.   Healthy Mind-Set    Smoking Cessation  Clinical staff conducted group or individual video education with verbal and written material and guidebook.  Patient learns that cigarette smoking and tobacco addiction pose a serious health risk which affects millions of people. Stopping smoking will significantly reduce the risk of heart disease, lung disease, and many forms of cancer. Recommended strategies for quitting are covered, including working with your doctor to develop a successful plan.  Culinary   Becoming a Set Designer conducted group or  individual video education with verbal and written material and guidebook.  Patient learns that cooking at home can be healthy, cost-effective, quick, and puts them in control. Keys to cooking healthy recipes will include looking at your recipe, assessing your equipment needs, planning ahead, making it simple, choosing cost-effective seasonal ingredients, and limiting the use of added fats, salts, and sugars.  Cooking - Breakfast and Snacks  Clinical staff conducted group or individual video education with verbal and written material and guidebook.  Patient learns how important breakfast is to satiety and nutrition through the entire day. Recommendations include key foods to eat during breakfast to help stabilize blood sugar levels and to prevent overeating at meals later in the day. Planning ahead is also a key component.  Cooking - Educational Psychologist conducted group or individual video  education with verbal and written material and guidebook.  Patient learns eating strategies to improve overall health, including an approach to cook more at home. Recommendations include thinking of animal protein as a side on your plate rather than center stage and focusing instead on lower calorie dense options like vegetables, fruits, whole grains, and plant-based proteins, such as beans. Making sauces in large quantities to freeze for later and leaving the skin on your vegetables are also recommended to maximize your experience.  Cooking - Healthy Salads and Dressing Clinical staff conducted group or individual video education with verbal and written material and guidebook.  Patient learns that vegetables, fruits, whole grains, and legumes are the foundations of the Pritikin Eating Plan. Recommendations include how to incorporate each of these in flavorful and healthy salads, and how to create homemade salad dressings. Proper handling of ingredients is also covered. Cooking - Soups and [employer] - Soups and Desserts Clinical staff conducted group or individual video education with verbal and written material and guidebook.  Patient learns that Pritikin soups and desserts make for easy, nutritious, and delicious snacks and meal components that are low in sodium, fat, sugar, and calorie density, while high in vitamins, minerals, and filling fiber. Recommendations include simple and healthy ideas for soups and desserts.   Overview     The Pritikin Solution Program Overview Clinical staff conducted group or individual video education with verbal and written material and guidebook.  Patient learns that the results of the Pritikin Program have been documented in more than 100 articles published in peer-reviewed journals, and the benefits include reducing risk factors for (and, in some cases, even reversing) high cholesterol, high blood pressure, type 2 diabetes, obesity, and more! An overview of  the three key pillars of the Pritikin Program will be covered: eating well, doing regular exercise, and having a healthy mind-set.  WORKSHOPS  Exercise: Exercise Basics: Building Your Action Plan Clinical staff led group instruction and group discussion with PowerPoint presentation and patient guidebook. To enhance the learning environment the use of posters, models and videos may be added. At the conclusion of this workshop, patients will comprehend the difference between physical activity and exercise, as well as the benefits of incorporating both, into their routine. Patients will understand the FITT (Frequency, Intensity, Time, and Type) principle and how to use it to build an exercise action plan. In addition, safety concerns and other considerations for exercise and cardiac rehab will be addressed by the presenter. The purpose of this lesson is to promote a comprehensive and effective weekly exercise routine in order to improve patients overall level of fitness.   Managing Heart Disease:  Your Path to a Healthier Heart Clinical staff led group instruction and group discussion with PowerPoint presentation and patient guidebook. To enhance the learning environment the use of posters, models and videos may be added.At the conclusion of this workshop, patients will understand the anatomy and physiology of the heart. Additionally, they will understand how Pritikins three pillars impact the risk factors, the progression, and the management of heart disease.  The purpose of this lesson is to provide a high-level overview of the heart, heart disease, and how the Pritikin lifestyle positively impacts risk factors.  Exercise Biomechanics Clinical staff led group instruction and group discussion with PowerPoint presentation and patient guidebook. To enhance the learning environment the use of posters, models and videos may be added. Patients will learn how the structural parts of their bodies  function and how these functions impact their daily activities, movement, and exercise. Patients will learn how to promote a neutral spine, learn how to manage pain, and identify ways to improve their physical movement in order to promote healthy living. The purpose of this lesson is to expose patients to common physical limitations that impact physical activity. Participants will learn practical ways to adapt and manage aches and pains, and to minimize their effect on regular exercise. Patients will learn how to maintain good posture while sitting, walking, and lifting.  Balance Training and Fall Prevention  Clinical staff led group instruction and group discussion with PowerPoint presentation and patient guidebook. To enhance the learning environment the use of posters, models and videos may be added. At the conclusion of this workshop, patients will understand the importance of their sensorimotor skills (vision, proprioception, and the vestibular system) in maintaining their ability to balance as they age. Patients will apply a variety of balancing exercises that are appropriate for their current level of function. Patients will understand the common causes for poor balance, possible solutions to these problems, and ways to modify their physical environment in order to minimize their fall risk. The purpose of this lesson is to teach patients about the importance of maintaining balance as they age and ways to minimize their risk of falling.  WORKSHOPS   Nutrition:  Fueling a Ship Broker led group instruction and group discussion with PowerPoint presentation and patient guidebook. To enhance the learning environment the use of posters, models and videos may be added. Patients will review the foundational principles of the Pritikin Eating Plan and understand what constitutes a serving size in each of the food groups. Patients will also learn Pritikin-friendly foods that are better  choices when away from home and review make-ahead meal and snack options. Calorie density will be reviewed and applied to three nutrition priorities: weight maintenance, weight loss, and weight gain. The purpose of this lesson is to reinforce (in a group setting) the key concepts around what patients are recommended to eat and how to apply these guidelines when away from home by planning and selecting Pritikin-friendly options. Patients will understand how calorie density may be adjusted for different weight management goals.  Mindful Eating  Clinical staff led group instruction and group discussion with PowerPoint presentation and patient guidebook. To enhance the learning environment the use of posters, models and videos may be added. Patients will briefly review the concepts of the Pritikin Eating Plan and the importance of low-calorie dense foods. The concept of mindful eating will be introduced as well as the importance of paying attention to internal hunger signals. Triggers for non-hunger eating and techniques for dealing  with triggers will be explored. The purpose of this lesson is to provide patients with the opportunity to review the basic principles of the Pritikin Eating Plan, discuss the value of eating mindfully and how to measure internal cues of hunger and fullness using the Hunger Scale. Patients will also discuss reasons for non-hunger eating and learn strategies to use for controlling emotional eating.  Targeting Your Nutrition Priorities Clinical staff led group instruction and group discussion with PowerPoint presentation and patient guidebook. To enhance the learning environment the use of posters, models and videos may be added. Patients will learn how to determine their genetic susceptibility to disease by reviewing their family history. Patients will gain insight into the importance of diet as part of an overall healthy lifestyle in mitigating the impact of genetics and other  environmental insults. The purpose of this lesson is to provide patients with the opportunity to assess their personal nutrition priorities by looking at their family history, their own health history and current risk factors. Patients will also be able to discuss ways of prioritizing and modifying the Pritikin Eating Plan for their highest risk areas  Menu  Clinical staff led group instruction and group discussion with PowerPoint presentation and patient guidebook. To enhance the learning environment the use of posters, models and videos may be added. Using menus brought in from [employer], or printed from [employer], patients will apply the Pritikin dining out guidelines that were presented in the [employer] video. Patients will also be able to practice these guidelines in a variety of provided scenarios. The purpose of this lesson is to provide patients with the opportunity to practice hands-on learning of the Pritikin Dining Out guidelines with actual menus and practice scenarios.  Label Reading Clinical staff led group instruction and group discussion with PowerPoint presentation and patient guidebook. To enhance the learning environment the use of posters, models and videos may be added. Patients will review and discuss the Pritikin label reading guidelines presented in [facility]ins Label Reading Educational series video. Using fool labels brought in from local grocery stores and markets, patients will apply the label reading guidelines and determine if the packaged food meet the Pritikin guidelines. The purpose of this lesson is to provide patients with the opportunity to review, discuss, and practice hands-on learning of the Pritikin Label Reading guidelines with actual packaged food labels. Cooking School  Pritikins [employer] are designed to teach patients ways to prepare quick, simple, and affordable recipes at home. The importance of nutritions role in  chronic disease risk reduction is reflected in its emphasis in the overall Pritikin program. By learning how to prepare essential core Pritikin Eating Plan recipes, patients will increase control over what they eat; be able to customize the flavor of foods without the use of added salt, sugar, or fat; and improve the quality of the food they consume. By learning a set of core recipes which are easily assembled, quickly prepared, and affordable, patients are more likely to prepare more healthy foods at home. These workshops focus on convenient breakfasts, simple entres, side dishes, and desserts which can be prepared with minimal effort and are consistent with nutrition recommendations for cardiovascular risk reduction. Cooking [employer] are taught by a armed forces logistics/support/administrative officer (RD) who has been trained by the [employer]. The chef or RD has a clear understanding of the importance of minimizing - if not completely eliminating - added fat, sugar, and sodium in recipes. Throughout the  series of Cooking School Workshop sessions, patients will learn about healthy ingredients and efficient methods of cooking to build confidence in their capability to prepare    Cooking School weekly topics:  Adding Flavor- Sodium-Free  Fast and Healthy Breakfasts  Powerhouse Plant-Based Proteins  Satisfying Salads and Dressings  Simple Sides and Sauces  International Cuisine-Spotlight on the [employer] Zones  Delicious Desserts  Savory Soups  [employer] - Meals in a Astronomer Appetizers and Snacks  Comforting Weekend Breakfasts  One-Pot Wonders   Fast Evening Meals  Landscape Architect Your Pritikin Plate  WORKSHOPS   Healthy Mindset (Psychosocial):  Focused Goals, Sustainable Changes Clinical staff led group instruction and group discussion with PowerPoint presentation and patient guidebook. To enhance the learning environment the use of posters, models and videos may  be added. Patients will be able to apply effective goal setting strategies to establish at least one personal goal, and then take consistent, meaningful action toward that goal. They will learn to identify common barriers to achieving personal goals and develop strategies to overcome them. Patients will also gain an understanding of how our mind-set can impact our ability to achieve goals and the importance of cultivating a positive and growth-oriented mind-set. The purpose of this lesson is to provide patients with a deeper understanding of how to set and achieve personal goals, as well as the tools and strategies needed to overcome common obstacles which may arise along the way.  From Head to Heart: The Power of a Healthy Outlook  Clinical staff led group instruction and group discussion with PowerPoint presentation and patient guidebook. To enhance the learning environment the use of posters, models and videos may be added. Patients will be able to recognize and describe the impact of emotions and mood on physical health. They will discover the importance of self-care and explore self-care practices which may work for them. Patients will also learn how to utilize the 4 Cs to cultivate a healthier outlook and better manage stress and challenges. The purpose of this lesson is to demonstrate to patients how a healthy outlook is an essential part of maintaining good health, especially as they continue their cardiac rehab journey.  Healthy Sleep for a Healthy Heart Clinical staff led group instruction and group discussion with PowerPoint presentation and patient guidebook. To enhance the learning environment the use of posters, models and videos may be added. At the conclusion of this workshop, patients will be able to demonstrate knowledge of the importance of sleep to overall health, well-being, and quality of life. They will understand the symptoms of, and treatments for, common sleep disorders. Patients  will also be able to identify daytime and nighttime behaviors which impact sleep, and they will be able to apply these tools to help manage sleep-related challenges. The purpose of this lesson is to provide patients with a general overview of sleep and outline the importance of quality sleep. Patients will learn about a few of the most common sleep disorders. Patients will also be introduced to the concept of sleep hygiene, and discover ways to self-manage certain sleeping problems through simple daily behavior changes. Finally, the workshop will motivate patients by clarifying the links between quality sleep and their goals of heart-healthy living.   Recognizing and Reducing Stress Clinical staff led group instruction and group discussion with PowerPoint presentation and patient guidebook. To enhance the learning environment the use of posters, models and videos may be added. At the conclusion of this workshop, patients will  be able to understand the types of stress reactions, differentiate between acute and chronic stress, and recognize the impact that chronic stress has on their health. They will also be able to apply different coping mechanisms, such as reframing negative self-talk. Patients will have the opportunity to practice a variety of stress management techniques, such as deep abdominal breathing, progressive muscle relaxation, and/or guided imagery.  The purpose of this lesson is to educate patients on the role of stress in their lives and to provide healthy techniques for coping with it.  Learning Barriers/Preferences:  Learning Barriers/Preferences - 07/16/24 1110       Learning Barriers/Preferences   Learning Barriers Exercise Concerns   neck problems   Learning Preferences Video;Pictoral          Education Topics:  Knowledge Questionnaire Score:  Knowledge Questionnaire Score - 07/16/24 1417       Knowledge Questionnaire Score   Pre Score 24/24          Core  Components/Risk Factors/Patient Goals at Admission:  Personal Goals and Risk Factors at Admission - 07/16/24 1155       Core Components/Risk Factors/Patient Goals on Admission   Lipids Yes    Intervention Provide education and support for participant on nutrition & aerobic/resistive exercise along with prescribed medications to achieve LDL 70mg , HDL >40mg .    Expected Outcomes Short Term: Participant states understanding of desired cholesterol values and is compliant with medications prescribed. Participant is following exercise prescription and nutrition guidelines.;Long Term: Cholesterol controlled with medications as prescribed, with individualized exercise RX and with personalized nutrition plan. Value goals: LDL < 70mg , HDL > 40 mg.          Core Components/Risk Factors/Patient Goals Review:   Goals and Risk Factor Review     Row Name 08/20/24 1514 09/17/24 1322           Core Components/Risk Factors/Patient Goals Review   Personal Goals Review Weight Management/Obesity;Lipids Weight Management/Obesity;Lipids      Review [name] is off to a good start to exercise at cardiac rehab. Vital signs have been stable. [name] has increased her workloads. [name] is doing well with  exercise at cardiac rehab. Vital signs have been stable. [name] has increased her workloads.      Expected Outcomes [name] will continue to participate in cardiac rehab for exercise, nutrition and lifestyle modifications [name] will continue to participate in cardiac rehab for exercise, nutrition and lifestyle modifications         Core Components/Risk Factors/Patient Goals at Discharge (Final Review):   Goals and Risk Factor Review - 09/17/24 1322       Core Components/Risk Factors/Patient Goals Review   Personal Goals Review Weight Management/Obesity;Lipids    Review [name] is doing well with  exercise at cardiac rehab. Vital signs have been stable. [name] has increased her workloads.    Expected Outcomes [name] will continue to  participate in cardiac rehab for exercise, nutrition and lifestyle modifications          ITP Comments:  ITP Comments     Row Name 07/16/24 1026 07/24/24 1127 08/20/24 1510 09/17/24 1320     ITP Comments Wilbert Bihari, MD: Medical Director. Introduction to the [facility] Education Program/Intensive Cardiac Rehab. Initial orientation packet reviewed with the patient. 30-day ITP review. [name] started the cardiac rehab program today. She did well on the recumbent bike and recumbent stepper. No symptoms noted. She also attended the cooking school education class today. 30-day ITP review. [name] is off to  a good start to exercise at cardiac rehab 30-day ITP review. [name] has good attendance and participation with exercise at cardiac rehab       Comments: See ITP Comments    [1]  Current Outpatient Medications:    acetaminophen  (TYLENOL ) 500 MG tablet, Take 1,000 mg by mouth every 6 (six) hours as needed for moderate pain (pain score 4-6)., Disp: , Rfl:    aspirin  81 MG chewable tablet, Chew 81 mg by mouth daily., Disp: , Rfl:    Cyanocobalamin (B-12 PO), Take 1 capsule by mouth daily., Disp: , Rfl:    cyclobenzaprine (FLEXERIL) 10 MG tablet, TAKE 1 TABLET BY MOUTH IN THE EVENING AS NEEDED, Disp: , Rfl:    fexofenadine-pseudoephedrine (ALLEGRA-D) 60-120 MG 12 hr tablet, Take 1 tablet by mouth daily as needed (allergies)., Disp: , Rfl:    MAGNESIUM GLYCINATE PO, Take 2 capsules by mouth daily., Disp: , Rfl:    meloxicam (MOBIC) 15 MG tablet, Take 15 mg by mouth daily as needed for pain., Disp: , Rfl:    metoprolol tartrate (LOPRESSOR) 25 MG tablet, Take 12.5 mg by mouth 2 (two) times daily., Disp: , Rfl:    Multiple Vitamin (MULTIVITAMIN) tablet, Take 1 tablet by mouth daily., Disp: , Rfl:    Omega-3 Fatty Acids (FISH OIL) 1200 MG CAPS, 1 capsule., Disp: , Rfl:    Turmeric 500 MG TABS, takes 2400 mg Orally (Patient not taking: Reported on 07/16/2024), Disp: , Rfl:    TURMERIC-GINGER PO, Take 2 capsules  by mouth daily., Disp: , Rfl:    VITAMIN D-VITAMIN K PO, Take 2 capsules by mouth daily., Disp: , Rfl:   Current Facility-Administered Medications:    0.9 %  sodium chloride  infusion, 500 mL, Intravenous, Once, Abran Norleen SAILOR, MD [2]  Social History Tobacco Use  Smoking Status Never  Smokeless Tobacco Never  Tobacco Comments   Never smoked 06/11/24

## 2024-09-18 ENCOUNTER — Telehealth (HOSPITAL_COMMUNITY): Payer: Self-pay

## 2024-09-18 ENCOUNTER — Encounter (HOSPITAL_COMMUNITY)

## 2024-09-18 NOTE — Telephone Encounter (Signed)
 Patient c/o for 10:15 CR class due to icy roads.

## 2024-09-20 ENCOUNTER — Ambulatory Visit: Attending: Cardiovascular Disease | Admitting: Cardiovascular Disease

## 2024-09-20 ENCOUNTER — Encounter: Payer: Self-pay | Admitting: Cardiovascular Disease

## 2024-09-20 VITALS — BP 122/80 | HR 85 | Ht 61.0 in | Wt 133.0 lb

## 2024-09-20 DIAGNOSIS — E782 Mixed hyperlipidemia: Secondary | ICD-10-CM

## 2024-09-20 DIAGNOSIS — R7303 Prediabetes: Secondary | ICD-10-CM

## 2024-09-20 DIAGNOSIS — Z9889 Other specified postprocedural states: Secondary | ICD-10-CM

## 2024-09-20 DIAGNOSIS — I9789 Other postprocedural complications and disorders of the circulatory system, not elsewhere classified: Secondary | ICD-10-CM

## 2024-09-20 DIAGNOSIS — R7401 Elevation of levels of liver transaminase levels: Secondary | ICD-10-CM

## 2024-09-20 DIAGNOSIS — I4891 Unspecified atrial fibrillation: Secondary | ICD-10-CM | POA: Diagnosis not present

## 2024-09-20 MED ORDER — AMOXICILLIN 500 MG PO TABS: 2000.0000 mg | ORAL_TABLET | Freq: Every day | ORAL | 4 refills | Status: DC | PRN

## 2024-09-20 MED ORDER — AMOXICILLIN 500 MG PO TABS: 2000.0000 mg | ORAL_TABLET | Freq: Every day | ORAL | 4 refills | Status: AC | PRN

## 2024-09-20 NOTE — Progress Notes (Unsigned)
 " Cardiology Office Note:    Date:  09/21/2024   ID:  Beth Chapman, DOB Oct 22, 1950, MRN [id number]  PCP:  Clarice Nottingham, MD   [facility] Providers Cardiologist:  Jerel Balding, MD     Referring MD: Clarice Nottingham, MD   Chief Complaint  Patient presents with   Cardiac Valve Problem     History of Present Illness:    Beth Chapman is a 74 y.o. female with a hx of MVP with flail P2 scallop of the posterior leaflet and successful MV repair ([facility], Dr. Ricky, via [facility], [employer] Simulus semirigid sz36, 05/13/2024), complicated by a single episode of early postop AFib,  mild hypercholesterolemia, migraines, arthritis.  She had normal coronary arteries and normal pulmonary artery pressure at the time left heart catheterization in May 2025 before her surgery.  In 2023 at age 65 she had a coronary calcium score of 0.  Recovery from surgery was a little longer than she expected and she still has some numbness in her right chest at the site of the thoracotomy, but she feels well.  In fact, retrospectively, she now realizes that her breathing and overall function status were restricted by her mitral valve problems and she has more stamina now.  She had 1 visit to the emergency room 06/01/2024 for postop atrial fibrillation with extreme rapid ventricular response (198 bpm), but has not had any recurrence of palpitations or tachycardia since then.  She did wear an arrhythmia monitor for 2 weeks in early November.  This did not show atrial fibrillation, but did show occasional PACs (2%) and about 50 episodes of nonsustained atrial tachycardia (all asymptomatic).  She is wearing a smart watch.  Beth Chapman is asymptomatic.  She is able to perform regular physical activity around the house in the yard and participate in water aerobics 3 times a week without any limitations from shortness of breath.  She does not have orthopnea, PND or lower extremity edema and does not have chest pain either at  rest or with activity.  She denies palpitations.  She has never experienced syncope.  Labs performed 06/01/2024 shows surprisingly high levels of transaminases (AST 221, ALT 100) but otherwise normal liver tests.  She had a repeat lipid profile 06/26/2024 which once again showed fairly severe hypercholesterolemia with a total cholesterol 269, HDL 73, LDL 168 and borderline triglycerides at 157.  Also has borderline glucose levels with a hemoglobin A1c of 5.8%.  She has normal renal and thyroid  function tests.  She had  There is no family history of coronary artery disease, but there is a strong family history of Alzheimer's disease (father, multiple paternal aunts and uncles and even a 74 year old first cousin).  For this reason she is always been reluctant to receive a prescription for statins.  Her husband Rodgers is also my patient.  Past Medical History:  Diagnosis Date   Allergy    Anxiety    Arthritis    Classic migraine 11/12/2013   Heart murmur    Migraines     Past Surgical History:  Procedure Laterality Date   CARDIAC CATHETERIZATION     LAPAROSCOPIC ABDOMINAL EXPLORATION     MITRAL VALVE REPAIR  05/13/2024   At [facility] Dr Ricky   none     RIGHT/LEFT HEART CATH AND CORONARY ANGIOGRAPHY N/A 01/04/2024   Procedure: RIGHT/LEFT HEART CATH AND CORONARY ANGIOGRAPHY;  Surgeon: Jordan, Peter M, MD;  Location: [facility] INVASIVE CV LAB;  Service: Cardiovascular;  Laterality: N/A;  TRANSESOPHAGEAL ECHOCARDIOGRAM (CATH LAB) N/A 01/02/2024   Procedure: TRANSESOPHAGEAL ECHOCARDIOGRAM;  Surgeon: Francyne Headland, MD;  Location: [facility] INVASIVE CV LAB;  Service: Cardiovascular;  Laterality: N/A;    Current Medications: Current Meds  Medication Sig   acetaminophen  (TYLENOL ) 500 MG tablet Take 1,000 mg by mouth every 6 (six) hours as needed for moderate pain (pain score 4-6).   aspirin  81 MG chewable tablet Chew 81 mg by mouth daily.   Cyanocobalamin (B-12 PO) Take 1 capsule by mouth daily.    cyclobenzaprine (FLEXERIL) 10 MG tablet TAKE 1 TABLET BY MOUTH IN THE EVENING AS NEEDED   fexofenadine-pseudoephedrine (ALLEGRA-D) 60-120 MG 12 hr tablet Take 1 tablet by mouth daily as needed (allergies).   MAGNESIUM GLYCINATE PO Take 2 capsules by mouth daily.   meloxicam (MOBIC) 15 MG tablet Take 15 mg by mouth daily as needed for pain.   metoprolol tartrate (LOPRESSOR) 25 MG tablet Take 12.5 mg by mouth 2 (two) times daily.   Multiple Vitamin (MULTIVITAMIN) tablet Take 1 tablet by mouth daily.   Omega-3 Fatty Acids (FISH OIL) 1200 MG CAPS 1 capsule.   TURMERIC-GINGER PO Take 2 capsules by mouth daily.   VITAMIN D-VITAMIN K PO Take 2 capsules by mouth daily.   [DISCONTINUED] amoxicillin  (AMOXIL ) 500 MG tablet Take 4 tablets (2,000 mg total) by mouth daily as needed (Take 1 to 2 hours prior to dental procedures).   Current Facility-Administered Medications for the 09/20/24 encounter (Office Visit) with Donnavan Covault, Headland, MD  Medication   0.9 %  sodium chloride  infusion     Allergies:   Patient has no known allergies.       Family History: The patient's family history includes Dementia in her father; Diabetes in her brother; Heart attack in her brother; Kidney failure in her brother. There is no history of Migraines, Colon cancer, Esophageal cancer, Stomach cancer, or Rectal cancer.  ROS:   Please see the history of present illness.     All other systems reviewed and are negative.  EKGs/Labs/Other Studies Reviewed:    The following studies were reviewed today: Labs 10/27/2022  Coronary calcium score 2023  1. Coronary calcium score of 0. 2. Top-normal heart size and calcified mitral valve annulus.    Echocardiogram  11/11/2022  1. Left ventricular ejection fraction, by estimation, is 55%. The left  ventricle has normal function. The left ventricle has no regional wall  motion abnormalities. Left ventricular diastolic parameters are  indeterminate.   2. Right ventricular  systolic function is normal. The right ventricular  size is normal. There is normal pulmonary artery systolic pressure. The  estimated right ventricular systolic pressure is 22.4 mmHg.   3. Left atrial size was mildly dilated.   4. Bileaflet mitral valve prolapse with mitral annular disjunction. The  mitral valve is myxomatous. Moderate to severe mitral valve regurgitation.  No evidence of mitral stenosis.   5. The aortic valve is tricuspid. There is mild calcification of the  aortic valve. Aortic valve regurgitation is not visualized. No aortic  stenosis is present.   6. The inferior vena cava is normal in size with greater than 50%  respiratory variability, suggesting right atrial pressure of 3 mmHg.   7. Cannot exclude a small PFO.   05/13/2024 echocardiogram at [facility] NORMAL LEFT VENTRICULAR SYSTOLIC FUNCTION WITH MILD LVH ESTIMATED EF: 55% DIASTOLIC FUNCTION CAN'T BE DETERMINED NORMAL RIGHT VENTRICULAR SYSTOLIC FUNCTION VALVULAR REGURGITATION: TRIVIAL AR, TRIVIAL MR, MILD PR, MILD TR ESTIMATED RVSP: 26 mmHg (Normal) VALVULAR STENOSIS:  No AS, PROSTHETIC MV RING, No PS, No TS ------------------------------------------------------------------------------------------ LOW-NORMAL LV FUNCTION WITH AN LVEF ESTIMATED AT 50-55%. THERE IS MILD SEPTAL BOUNCE OF THE INTERVENTRICULAR SEPTUM CONSISTENT WITH PRIOR CARDIAC SURGERY. MV: medtronic simulus semi rigid band sz36  Mitral Valve           MV Pk. Vel.: 1         m/s                                              MV Pk. Grad.: 4         mmHg                                            MV Mn. Grad.: 2         mmHg       EKG:  EKG is ordered today.  Personally reviewed the most recent tracing from 06/11/2024 which shows sinus rhythm with a single PAC and incomplete left bundle branch block.  EKG Interpretation Date/Time:    Ventricular Rate:    PR Interval:    QRS Duration:    QT Interval:    QTC Calculation:   R Axis:      Text  Interpretation:           Recent Labs: 06/01/2024: ALT 100; BUN 23; Creatinine, Ser 0.89; Hemoglobin 11.7; Magnesium 2.0; Platelets 368; Potassium 4.0; Sodium 139; TSH 1.437  307 2024 Creatinine 0.82, potassium 4.7, hemoglobin A1c 6.1% Recent Lipid Panel No results found for: CHOL, TRIG, HDL, CHOLHDL, VLDL, LDLCALC, LDLDIRECT 10/27/2022 Total cholesterol 243, HDL 84, LDL 143, triglycerides 95  06/01/2024 Hemoglobin 11.7, creatinine 0.89, potassium 4.0, ALT 100, AST 121, albumin 3.1, normal bilirubin, TSH 1.437  06/26/2024 Cholesterol 269, HDL 73, LDL 168, triglycerides 157 Hemoglobin A1c 5.8%   Risk Assessment/Calculations:                Physical Exam:    VS:  BP 122/80   Pulse 85   Ht 5' 1 (1.549 m)   Wt 133 lb (60.3 kg)   SpO2 98%   BMI 25.13 kg/m     Wt Readings from Last 3 Encounters:  09/20/24 133 lb (60.3 kg)  07/16/24 134 lb 4.2 oz (60.9 kg)  06/11/24 130 lb 3.2 oz (59.1 kg)     General: Alert, oriented x3, no distress, well-healed surgical scar right thoracotomy Head: no evidence of trauma, PERRL, EOMI, no exophtalmos or lid lag, no myxedema, no xanthelasma; normal ears, nose and oropharynx Neck: normal jugular venous pulsations and no hepatojugular reflux; brisk carotid pulses without delay and no carotid bruits Chest: clear to auscultation, no signs of consolidation by percussion or palpation, normal fremitus, symmetrical and full respiratory excursions Cardiovascular: normal position and quality of the apical impulse, regular rhythm, normal first and second heart sounds, no murmurs, rubs or gallops Abdomen: no tenderness or distention, no masses by palpation, no abnormal pulsatility or arterial bruits, normal bowel sounds, no hepatosplenomegaly Extremities: no clubbing, cyanosis or edema; 2+ radial, ulnar and brachial pulses bilaterally; 2+ right femoral, posterior tibial and dorsalis pedis pulses; 2+ left femoral, posterior tibial and  dorsalis pedis pulses; no subclavian or femoral bruits Neurological: grossly nonfocal Psych: Normal mood and affect  ASSESSMENT:    1. H/O mitral valve repair   2. Postoperative atrial fibrillation (HCC)   3. Prediabetes   4. Mixed hyperlipidemia   5. High transaminase levels     PLAN:    In order of problems listed above:  MVP/MR: Excellent surgical result based on the echocardiogram from [facility].  Will repeat an echocardiogram here in September.  Reinforced the need for endocarditis prophylaxis.  No restrictions on physical activity at this point.  Anticipate excellent long-term result. Postop atrial fibrillation: This occurred in the first 30 days after surgery and does not mean that she will have recurrent arrhythmia necessarily, although it does increase to risk for late recurrence of atrial arrhythmias.  She does not require anticoagulation unless it does recur. preDM/HLP: Elevated total cholesterol and LDL cholesterol, but also with excellent HDL cholesterol and a calcium score of 0 at age 20.  She prefers not to take any lipid-lowering agents and I think that is reasonable.  On the other hand, I would double down on efforts to eat a healthy diet and remain physically active since she also has evidence of insulin resistance with A1c in prediabetes range and borderline hypertriglyceridemia. Abnormal transaminases.:  These were drawn less than a month after her valve surgery.  Could be postop abnormalities.  Could be a sign of MASH.  Prior to surgery on 05/12/2024 all her liver test including the transaminases were normal.  They should be reassessed.          Medication Adjustments/Labs and Tests Ordered: Current medicines are reviewed at length with the patient today.  Concerns regarding medicines are outlined above.  Orders Placed This Encounter  Procedures   ECHOCARDIOGRAM COMPLETE   Meds ordered this encounter  Medications   DISCONTD: amoxicillin  (AMOXIL ) 500 MG tablet     Sig: Take 4 tablets (2,000 mg total) by mouth daily as needed (Take 1 to 2 hours prior to dental procedures).    Dispense:  8 tablet    Refill:  4   amoxicillin  (AMOXIL ) 500 MG tablet    Sig: Take 4 tablets (2,000 mg total) by mouth daily as needed (Take 1 to 2 hours prior to dental procedures).    Dispense:  8 tablet    Refill:  4    Patient will call when she needs this filled- please put on file    Patient Instructions  Medication Instructions:  Take Amoxicillin  2,000 mg 1-2 hours before dental procedures *If you need a refill on your cardiac medications before your next appointment, please call your pharmacy*  Lab Work: None ordered If you have labs (blood work) drawn today and your tests are completely normal, you will receive your results only by: MyChart Message (if you have MyChart) OR A paper copy in the mail If you have any lab test that is abnormal or we need to change your treatment, we will call you to review the results.  Testing/Procedures: Your physician has requested that you have an echocardiogram- needed after September 25th. Echocardiography is a painless test that uses sound waves to create images of your heart. It provides your doctor with information about the size and shape of your heart and how well your hearts chambers and valves are working. This procedure takes approximately one hour. There are no restrictions for this procedure. Please do NOT wear cologne, perfume, aftershave, or lotions (deodorant is allowed). Please arrive 15 minutes prior to your appointment time.  Please note: We ask at that you not bring  children with you during ultrasound (echo/ vascular) testing. Due to room size and safety concerns, children are not allowed in the ultrasound rooms during exams. Our front office staff cannot provide observation of children in our lobby area while testing is being conducted. An adult accompanying a patient to their appointment will only be allowed in the  ultrasound room at the discretion of the ultrasound technician under special circumstances. We apologize for any inconvenience.   Follow-Up: At [facility], you and your health needs are our priority.  As part of our continuing mission to provide you with exceptional heart care, our providers are all part of one team.  This team includes your primary Cardiologist (physician) and Advanced Practice Providers or APPs (Physician Assistants and Nurse Practitioners) who all work together to provide you with the care you need, when you need it.  Your next appointment:   1 year(s)  Provider:   Jerel Balding, MD    We recommend signing up for the patient portal called MyChart.  Sign up information is provided on this After Visit Summary.  MyChart is used to connect with patients for Virtual Visits (Telemedicine).  Patients are able to view lab/test results, encounter notes, upcoming appointments, etc.  Non-urgent messages can be sent to your provider as well.   To learn more about what you can do with MyChart, go to [email or web address].         Signed, Jerel Balding, MD  09/21/2024 6:25 PM    [facility]  "

## 2024-09-20 NOTE — Patient Instructions (Addendum)
 Medication Instructions:  Take Amoxicillin  2,000 mg 1-2 hours before dental procedures *If you need a refill on your cardiac medications before your next appointment, please call your pharmacy*  Lab Work: None ordered If you have labs (blood work) drawn today and your tests are completely normal, you will receive your results only by: MyChart Message (if you have MyChart) OR A paper copy in the mail If you have any lab test that is abnormal or we need to change your treatment, we will call you to review the results.  Testing/Procedures: Your physician has requested that you have an echocardiogram- needed after September 25th. Echocardiography is a painless test that uses sound waves to create images of your heart. It provides your doctor with information about the size and shape of your heart and how well your hearts chambers and valves are working. This procedure takes approximately one hour. There are no restrictions for this procedure. Please do NOT wear cologne, perfume, aftershave, or lotions (deodorant is allowed). Please arrive 15 minutes prior to your appointment time.  Please note: We ask at that you not bring children with you during ultrasound (echo/ vascular) testing. Due to room size and safety concerns, children are not allowed in the ultrasound rooms during exams. Our front office staff cannot provide observation of children in our lobby area while testing is being conducted. An adult accompanying a patient to their appointment will only be allowed in the ultrasound room at the discretion of the ultrasound technician under special circumstances. We apologize for any inconvenience.   Follow-Up: At [facility], you and your health needs are our priority.  As part of our continuing mission to provide you with exceptional heart care, our providers are all part of one team.  This team includes your primary Cardiologist (physician) and Advanced Practice Providers or APPs  (Physician Assistants and Nurse Practitioners) who all work together to provide you with the care you need, when you need it.  Your next appointment:   1 year(s)  Provider:   Jerel Balding, MD    We recommend signing up for the patient portal called MyChart.  Sign up information is provided on this After Visit Summary.  MyChart is used to connect with patients for Virtual Visits (Telemedicine).  Patients are able to view lab/test results, encounter notes, upcoming appointments, etc.  Non-urgent messages can be sent to your provider as well.   To learn more about what you can do with MyChart, go to [email or web address].

## 2024-09-21 ENCOUNTER — Encounter: Payer: Self-pay | Admitting: Cardiovascular Disease

## 2024-09-21 DIAGNOSIS — E782 Mixed hyperlipidemia: Secondary | ICD-10-CM | POA: Insufficient documentation

## 2024-09-21 DIAGNOSIS — Z9889 Other specified postprocedural states: Secondary | ICD-10-CM | POA: Insufficient documentation

## 2024-09-21 DIAGNOSIS — I4891 Unspecified atrial fibrillation: Secondary | ICD-10-CM | POA: Insufficient documentation

## 2024-09-21 DIAGNOSIS — R7303 Prediabetes: Secondary | ICD-10-CM | POA: Insufficient documentation

## 2024-09-23 ENCOUNTER — Encounter (HOSPITAL_COMMUNITY)

## 2024-09-25 ENCOUNTER — Encounter (HOSPITAL_COMMUNITY)

## 2024-09-25 DIAGNOSIS — Z9889 Other specified postprocedural states: Secondary | ICD-10-CM

## 2024-09-27 ENCOUNTER — Encounter (HOSPITAL_COMMUNITY)

## 2024-09-30 ENCOUNTER — Encounter (HOSPITAL_COMMUNITY)

## 2024-10-02 ENCOUNTER — Encounter (HOSPITAL_COMMUNITY)

## 2024-10-04 ENCOUNTER — Encounter (HOSPITAL_COMMUNITY)

## 2024-10-07 ENCOUNTER — Encounter (HOSPITAL_COMMUNITY)

## 2024-10-09 ENCOUNTER — Encounter (HOSPITAL_COMMUNITY)

## 2024-10-11 ENCOUNTER — Encounter (HOSPITAL_COMMUNITY)

## 2024-10-14 ENCOUNTER — Encounter (HOSPITAL_COMMUNITY)

## 2024-10-16 ENCOUNTER — Encounter (HOSPITAL_COMMUNITY)

## 2024-10-18 ENCOUNTER — Encounter (HOSPITAL_COMMUNITY)

## 2025-05-19 ENCOUNTER — Ambulatory Visit (HOSPITAL_COMMUNITY)
